# Patient Record
Sex: Female | Born: 1945 | Race: White | Hispanic: No | Marital: Married | Smoking: Never smoker
Health system: Southern US, Community
[De-identification: ages and names within clinical notes are randomized; demographics above are authoritative.]

## PROBLEM LIST (undated history)

## (undated) DIAGNOSIS — E669 Obesity, unspecified: Secondary | ICD-10-CM

## (undated) DIAGNOSIS — J309 Allergic rhinitis, unspecified: Secondary | ICD-10-CM

## (undated) DIAGNOSIS — E785 Hyperlipidemia, unspecified: Secondary | ICD-10-CM

## (undated) DIAGNOSIS — I10 Essential (primary) hypertension: Secondary | ICD-10-CM

## (undated) DIAGNOSIS — M545 Low back pain, unspecified: Secondary | ICD-10-CM

## (undated) DIAGNOSIS — C50919 Malignant neoplasm of unspecified site of unspecified female breast: Secondary | ICD-10-CM

## (undated) DIAGNOSIS — E119 Type 2 diabetes mellitus without complications: Secondary | ICD-10-CM

## (undated) DIAGNOSIS — K7689 Other specified diseases of liver: Secondary | ICD-10-CM

## (undated) DIAGNOSIS — K580 Irritable bowel syndrome with diarrhea: Secondary | ICD-10-CM

## (undated) DIAGNOSIS — F0781 Postconcussional syndrome: Secondary | ICD-10-CM

## (undated) DIAGNOSIS — E1169 Type 2 diabetes mellitus with other specified complication: Secondary | ICD-10-CM

## (undated) DIAGNOSIS — M1990 Unspecified osteoarthritis, unspecified site: Secondary | ICD-10-CM

## (undated) DIAGNOSIS — Z9221 Personal history of antineoplastic chemotherapy: Secondary | ICD-10-CM

## (undated) DIAGNOSIS — Z923 Personal history of irradiation: Secondary | ICD-10-CM

## (undated) DIAGNOSIS — F329 Major depressive disorder, single episode, unspecified: Secondary | ICD-10-CM

## (undated) DIAGNOSIS — C801 Malignant (primary) neoplasm, unspecified: Secondary | ICD-10-CM

## (undated) HISTORY — PX: ABDOMINAL HYSTERECTOMY: SHX81

## (undated) HISTORY — DX: Hyperlipidemia, unspecified: E78.5

## (undated) HISTORY — DX: Other specified diseases of liver: K76.89

## (undated) HISTORY — DX: Irritable bowel syndrome with diarrhea: K58.0

## (undated) HISTORY — DX: Major depressive disorder, single episode, unspecified: F32.9

## (undated) HISTORY — DX: Postconcussional syndrome: F07.81

## (undated) HISTORY — DX: Allergic rhinitis, unspecified: J30.9

## (undated) HISTORY — PX: CHOLECYSTECTOMY: SHX55

## (undated) HISTORY — DX: Essential (primary) hypertension: I10

## (undated) HISTORY — DX: Low back pain: M54.5

## (undated) HISTORY — DX: Unspecified osteoarthritis, unspecified site: M19.90

## (undated) HISTORY — DX: Malignant neoplasm of unspecified site of unspecified female breast: C50.919

## (undated) HISTORY — DX: Low back pain, unspecified: M54.50

---

## 2000-05-25 DIAGNOSIS — C801 Malignant (primary) neoplasm, unspecified: Secondary | ICD-10-CM

## 2000-05-25 HISTORY — DX: Malignant (primary) neoplasm, unspecified: C80.1

## 2000-05-25 HISTORY — PX: BREAST LUMPECTOMY: SHX2

## 2000-07-16 ENCOUNTER — Encounter: Admission: RE | Admit: 2000-07-16 | Discharge: 2000-07-16 | Payer: Self-pay | Admitting: Family Medicine

## 2000-07-16 ENCOUNTER — Encounter: Payer: Self-pay | Admitting: Family Medicine

## 2000-08-30 ENCOUNTER — Encounter: Admission: RE | Admit: 2000-08-30 | Discharge: 2000-08-30 | Payer: Self-pay | Admitting: Family Medicine

## 2000-08-30 ENCOUNTER — Encounter (INDEPENDENT_AMBULATORY_CARE_PROVIDER_SITE_OTHER): Payer: Self-pay | Admitting: *Deleted

## 2000-08-30 ENCOUNTER — Encounter: Payer: Self-pay | Admitting: Family Medicine

## 2000-08-30 ENCOUNTER — Other Ambulatory Visit: Admission: RE | Admit: 2000-08-30 | Discharge: 2000-08-30 | Payer: Self-pay | Admitting: Family Medicine

## 2000-08-30 HISTORY — PX: BREAST BIOPSY: SHX20

## 2000-09-10 ENCOUNTER — Encounter: Admission: RE | Admit: 2000-09-10 | Discharge: 2000-09-10 | Payer: Self-pay | Admitting: General Surgery

## 2000-09-10 ENCOUNTER — Encounter: Payer: Self-pay | Admitting: General Surgery

## 2000-09-13 ENCOUNTER — Encounter (INDEPENDENT_AMBULATORY_CARE_PROVIDER_SITE_OTHER): Payer: Self-pay | Admitting: Specialist

## 2000-09-13 ENCOUNTER — Ambulatory Visit (HOSPITAL_BASED_OUTPATIENT_CLINIC_OR_DEPARTMENT_OTHER): Admission: RE | Admit: 2000-09-13 | Discharge: 2000-09-13 | Payer: Self-pay | Admitting: General Surgery

## 2000-09-13 ENCOUNTER — Encounter: Payer: Self-pay | Admitting: General Surgery

## 2000-09-13 ENCOUNTER — Encounter: Admission: RE | Admit: 2000-09-13 | Discharge: 2000-09-13 | Payer: Self-pay | Admitting: Ophthalmology

## 2000-09-13 HISTORY — PX: EXCISION / BIOPSY BREAST / NIPPLE / DUCT: SUR469

## 2000-09-27 ENCOUNTER — Encounter (INDEPENDENT_AMBULATORY_CARE_PROVIDER_SITE_OTHER): Payer: Self-pay | Admitting: *Deleted

## 2000-09-27 ENCOUNTER — Ambulatory Visit (HOSPITAL_BASED_OUTPATIENT_CLINIC_OR_DEPARTMENT_OTHER): Admission: RE | Admit: 2000-09-27 | Discharge: 2000-09-27 | Payer: Self-pay | Admitting: General Surgery

## 2001-02-07 ENCOUNTER — Ambulatory Visit: Admission: RE | Admit: 2001-02-07 | Discharge: 2001-05-08 | Payer: Self-pay | Admitting: Radiation Oncology

## 2001-02-11 ENCOUNTER — Encounter: Admission: RE | Admit: 2001-02-11 | Discharge: 2001-02-11 | Payer: Self-pay | Admitting: Radiation Oncology

## 2001-09-08 ENCOUNTER — Encounter: Payer: Self-pay | Admitting: Oncology

## 2001-09-08 ENCOUNTER — Encounter: Admission: RE | Admit: 2001-09-08 | Discharge: 2001-09-08 | Payer: Self-pay | Admitting: Oncology

## 2002-09-11 ENCOUNTER — Encounter: Admission: RE | Admit: 2002-09-11 | Discharge: 2002-09-11 | Payer: Self-pay | Admitting: Oncology

## 2002-09-11 ENCOUNTER — Encounter: Payer: Self-pay | Admitting: Oncology

## 2003-05-02 ENCOUNTER — Other Ambulatory Visit: Admission: RE | Admit: 2003-05-02 | Discharge: 2003-05-02 | Payer: Self-pay | Admitting: Dermatology

## 2003-09-18 ENCOUNTER — Encounter: Admission: RE | Admit: 2003-09-18 | Discharge: 2003-09-18 | Payer: Self-pay | Admitting: General Surgery

## 2004-09-29 ENCOUNTER — Encounter: Admission: RE | Admit: 2004-09-29 | Discharge: 2004-09-29 | Payer: Self-pay | Admitting: General Surgery

## 2004-11-03 ENCOUNTER — Encounter: Admission: RE | Admit: 2004-11-03 | Discharge: 2004-11-03 | Payer: Self-pay | Admitting: General Surgery

## 2004-12-11 ENCOUNTER — Ambulatory Visit (HOSPITAL_COMMUNITY): Admission: RE | Admit: 2004-12-11 | Discharge: 2004-12-11 | Payer: Self-pay | Admitting: Unknown Physician Specialty

## 2005-10-08 ENCOUNTER — Encounter: Admission: RE | Admit: 2005-10-08 | Discharge: 2005-10-08 | Payer: Self-pay | Admitting: General Surgery

## 2006-04-06 ENCOUNTER — Emergency Department (HOSPITAL_COMMUNITY): Admission: EM | Admit: 2006-04-06 | Discharge: 2006-04-06 | Payer: Self-pay | Admitting: Emergency Medicine

## 2006-06-28 ENCOUNTER — Ambulatory Visit: Payer: Self-pay | Admitting: Oncology

## 2006-07-12 LAB — CBC WITH DIFFERENTIAL/PLATELET
BASO%: 0.9 % (ref 0.0–2.0)
EOS%: 4.5 % (ref 0.0–7.0)
LYMPH%: 29.6 % (ref 14.0–48.0)
MCH: 27.2 pg (ref 26.0–34.0)
NEUT#: 4.3 10*3/uL (ref 1.5–6.5)
NEUT%: 58.4 % (ref 39.6–76.8)
RDW: 14.6 % — ABNORMAL HIGH (ref 11.3–14.5)
WBC: 7.3 10*3/uL (ref 3.9–10.0)
lymph#: 2.2 10*3/uL (ref 0.9–3.3)

## 2006-07-12 LAB — COMPREHENSIVE METABOLIC PANEL
ALT: 50 U/L — ABNORMAL HIGH (ref 0–35)
CO2: 24 mEq/L (ref 19–32)
Calcium: 9.8 mg/dL (ref 8.4–10.5)
Glucose, Bld: 124 mg/dL — ABNORMAL HIGH (ref 70–99)
Total Bilirubin: 0.3 mg/dL (ref 0.3–1.2)

## 2006-07-12 LAB — PROTIME-INR: Protime: 12 Seconds (ref 10.6–13.4)

## 2006-10-12 ENCOUNTER — Encounter: Admission: RE | Admit: 2006-10-12 | Discharge: 2006-10-12 | Payer: Self-pay | Admitting: General Surgery

## 2006-11-10 ENCOUNTER — Ambulatory Visit: Payer: Self-pay | Admitting: Oncology

## 2006-11-12 LAB — COMPREHENSIVE METABOLIC PANEL
BUN: 11 mg/dL (ref 6–23)
CO2: 23 mEq/L (ref 19–32)
Calcium: 9.6 mg/dL (ref 8.4–10.5)
Chloride: 104 mEq/L (ref 96–112)
Creatinine, Ser: 0.66 mg/dL (ref 0.40–1.20)
Total Bilirubin: 0.3 mg/dL (ref 0.3–1.2)

## 2006-11-12 LAB — CBC WITH DIFFERENTIAL/PLATELET
BASO%: 0.4 % (ref 0.0–2.0)
Basophils Absolute: 0 10*3/uL (ref 0.0–0.1)
Eosinophils Absolute: 0.3 10*3/uL (ref 0.0–0.5)
HCT: 39.8 % (ref 34.8–46.6)
HGB: 13.8 g/dL (ref 11.6–15.9)
LYMPH%: 17.8 % (ref 14.0–48.0)
MCH: 28.2 pg (ref 26.0–34.0)
MCHC: 34.6 g/dL (ref 32.0–36.0)
MCV: 81.4 fL (ref 81.0–101.0)
MONO#: 0.4 10*3/uL (ref 0.1–0.9)
MONO%: 3.7 % (ref 0.0–13.0)
NEUT#: 7.7 10*3/uL — ABNORMAL HIGH (ref 1.5–6.5)
NEUT%: 75.2 % (ref 39.6–76.8)
Platelets: 224 10*3/uL (ref 145–400)
RBC: 4.88 10*6/uL (ref 3.70–5.32)
RDW: 14.1 % (ref 11.3–14.5)
lymph#: 1.8 10*3/uL (ref 0.9–3.3)

## 2006-11-12 LAB — LACTATE DEHYDROGENASE: LDH: 149 U/L (ref 94–250)

## 2007-01-26 ENCOUNTER — Ambulatory Visit: Payer: Self-pay | Admitting: Oncology

## 2007-07-11 ENCOUNTER — Ambulatory Visit: Payer: Self-pay | Admitting: Oncology

## 2007-07-13 LAB — CBC WITH DIFFERENTIAL/PLATELET
Basophils Absolute: 0 10*3/uL (ref 0.0–0.1)
EOS%: 3.7 % (ref 0.0–7.0)
Eosinophils Absolute: 0.4 10*3/uL (ref 0.0–0.5)
HGB: 13.9 g/dL (ref 11.6–15.9)
MCH: 27.4 pg (ref 26.0–34.0)
MCV: 80.1 fL — ABNORMAL LOW (ref 81.0–101.0)
MONO%: 4 % (ref 0.0–13.0)
NEUT#: 8 10*3/uL — ABNORMAL HIGH (ref 1.5–6.5)
Platelets: 273 10*3/uL (ref 145–400)

## 2007-07-13 LAB — COMPREHENSIVE METABOLIC PANEL
ALT: 88 U/L — ABNORMAL HIGH (ref 0–35)
AST: 63 U/L — ABNORMAL HIGH (ref 0–37)
Albumin: 4.4 g/dL (ref 3.5–5.2)
Alkaline Phosphatase: 113 U/L (ref 39–117)
BUN: 10 mg/dL (ref 6–23)
CO2: 25 mEq/L (ref 19–32)
Calcium: 10.1 mg/dL (ref 8.4–10.5)
Chloride: 102 mEq/L (ref 96–112)
Total Bilirubin: 0.4 mg/dL (ref 0.3–1.2)
Total Protein: 7.2 g/dL (ref 6.0–8.3)

## 2007-10-13 ENCOUNTER — Encounter: Admission: RE | Admit: 2007-10-13 | Discharge: 2007-10-13 | Payer: Self-pay | Admitting: Surgery

## 2008-07-06 ENCOUNTER — Ambulatory Visit: Payer: Self-pay | Admitting: Oncology

## 2008-07-30 LAB — CBC WITH DIFFERENTIAL/PLATELET
Basophils Absolute: 0 10*3/uL (ref 0.0–0.1)
Eosinophils Absolute: 0.4 10*3/uL (ref 0.0–0.5)
HGB: 13.3 g/dL (ref 11.6–15.9)
LYMPH%: 28.7 % (ref 14.0–49.7)
MCH: 27.2 pg (ref 25.1–34.0)
MCHC: 33.1 g/dL (ref 31.5–36.0)
MONO#: 0.3 10*3/uL (ref 0.1–0.9)
NEUT#: 3.3 10*3/uL (ref 1.5–6.5)
NEUT%: 58.9 % (ref 38.4–76.8)
WBC: 5.5 10*3/uL (ref 3.9–10.3)

## 2008-07-30 LAB — COMPREHENSIVE METABOLIC PANEL
ALT: 75 U/L — ABNORMAL HIGH (ref 0–35)
Albumin: 4.2 g/dL (ref 3.5–5.2)
BUN: 11 mg/dL (ref 6–23)
CO2: 21 mEq/L (ref 19–32)
Calcium: 8.9 mg/dL (ref 8.4–10.5)
Glucose, Bld: 286 mg/dL — ABNORMAL HIGH (ref 70–99)
Sodium: 142 mEq/L (ref 135–145)
Total Bilirubin: 0.3 mg/dL (ref 0.3–1.2)
Total Protein: 6.6 g/dL (ref 6.0–8.3)

## 2008-10-16 ENCOUNTER — Encounter: Admission: RE | Admit: 2008-10-16 | Discharge: 2008-10-16 | Payer: Self-pay | Admitting: Oncology

## 2009-07-26 ENCOUNTER — Ambulatory Visit: Payer: Self-pay | Admitting: Oncology

## 2009-08-12 LAB — CBC WITH DIFFERENTIAL/PLATELET
BASO%: 0.6 % (ref 0.0–2.0)
Basophils Absolute: 0 10*3/uL (ref 0.0–0.1)
Eosinophils Absolute: 0.4 10*3/uL (ref 0.0–0.5)
HCT: 41.3 % (ref 34.8–46.6)
HGB: 13.9 g/dL (ref 11.6–15.9)
MONO%: 5.8 % (ref 0.0–14.0)
NEUT#: 5.2 10*3/uL (ref 1.5–6.5)
NEUT%: 66.7 % (ref 38.4–76.8)
lymph#: 1.7 10*3/uL (ref 0.9–3.3)

## 2009-08-12 LAB — LACTATE DEHYDROGENASE: LDH: 182 U/L (ref 94–250)

## 2009-08-12 LAB — COMPREHENSIVE METABOLIC PANEL
CO2: 25 mEq/L (ref 19–32)
Chloride: 102 mEq/L (ref 96–112)
Potassium: 3.9 mEq/L (ref 3.5–5.3)
Sodium: 139 mEq/L (ref 135–145)
Total Bilirubin: 0.4 mg/dL (ref 0.3–1.2)

## 2009-10-17 ENCOUNTER — Encounter: Admission: RE | Admit: 2009-10-17 | Discharge: 2009-10-17 | Payer: Self-pay | Admitting: Oncology

## 2010-08-25 ENCOUNTER — Other Ambulatory Visit: Payer: Self-pay | Admitting: Oncology

## 2010-08-25 ENCOUNTER — Encounter (HOSPITAL_BASED_OUTPATIENT_CLINIC_OR_DEPARTMENT_OTHER): Payer: Medicare Other | Admitting: Oncology

## 2010-08-25 DIAGNOSIS — Z170 Estrogen receptor positive status [ER+]: Secondary | ICD-10-CM

## 2010-08-25 DIAGNOSIS — C50219 Malignant neoplasm of upper-inner quadrant of unspecified female breast: Secondary | ICD-10-CM

## 2010-08-25 LAB — COMPREHENSIVE METABOLIC PANEL
ALT: 75 U/L — ABNORMAL HIGH (ref 0–35)
AST: 42 U/L — ABNORMAL HIGH (ref 0–37)
BUN: 13 mg/dL (ref 6–23)
Calcium: 9.8 mg/dL (ref 8.4–10.5)
Glucose, Bld: 129 mg/dL — ABNORMAL HIGH (ref 70–99)
Total Protein: 7.6 g/dL (ref 6.0–8.3)

## 2010-08-25 LAB — CBC WITH DIFFERENTIAL/PLATELET
Eosinophils Absolute: 0.5 10*3/uL (ref 0.0–0.5)
HGB: 14.1 g/dL (ref 11.6–15.9)
MCH: 26.6 pg (ref 25.1–34.0)
MCHC: 32.7 g/dL (ref 31.5–36.0)
MCV: 81.4 fL (ref 79.5–101.0)
MONO#: 0.7 10*3/uL (ref 0.1–0.9)
RBC: 5.29 10*6/uL (ref 3.70–5.45)
WBC: 9.6 10*3/uL (ref 3.9–10.3)
lymph#: 2.8 10*3/uL (ref 0.9–3.3)

## 2010-08-25 LAB — LACTATE DEHYDROGENASE: LDH: 159 U/L (ref 94–250)

## 2010-09-08 ENCOUNTER — Other Ambulatory Visit: Payer: Self-pay | Admitting: Oncology

## 2010-09-08 DIAGNOSIS — Z1231 Encounter for screening mammogram for malignant neoplasm of breast: Secondary | ICD-10-CM

## 2010-10-10 NOTE — Op Note (Signed)
[place]  Patient:    Diana Hall, Diana Hall                      MRN: [id number] Proc. Date: 09/27/00 Adm. Date:  [id number] Attending:  Janalyn Rouse CC:         Genene Churn. Cyndie Chime, M.D.   Operative Report  PREOPERATIVE DIAGNOSIS:  Carcinoma of the right breast.  POSTOPERATIVE DIAGNOSIS:  Carcinoma of the right breast.  OPERATION PERFORMED:  Re-excision of the biopsy site to get clean margins.  SURGEON:  Rose Phi. Maple Hudson, M.D.  ANESTHESIA:  General.  INDICATIONS FOR PROCEDURE:  This patient had just undergone a partial mastectomy and sentinel node biopsy.  Her sentinel node was negative  and all the margins were clean except the 9 oclock margin of this lesion which was in the subareolar area.  I am now going to re-excise that 9 oclock margin to see if we can get a clean margin.  DESCRIPTION OF PROCEDURE:  After suitable general anesthesia was induced, the patient was placed in the supine position with the right arm extended.  The breast was prepped and draped in the usual fashion.  The old incision centered at the 3 oclock  position of the right breast was then made and we entered a seroma cavity and emptied it.  The 9 oclock margin was then excised hemostasis was obtained with the cautery.  Subcutaneous tissues closed with 3-0 Vicryl suture and the skin with subcuticular 4-0 Monocryl with Steri-Strips.  Dressing applied.  The patient was transferred to the recovery room in satisfactory condition having tolerated the procedure well. DD:  09/27/00 TD:  09/27/00 Job: [id number]

## 2010-10-10 NOTE — Op Note (Signed)
[place]  Patient:    Diana Hall, Diana Hall                      MRN: [id number] Proc. Date: 09/13/00 Adm. Date:  [id number] Attending:  Janalyn Rouse CC:         Dr. Wyvonnia Lora, [facility] D. Manson Passey, M.D., [facility]   Operative Report  PREOPERATIVE DIAGNOSIS:  Small carcinoma of the right breast.  POSTOPERATIVE DIAGNOSIS:  Small carcinoma of the right breast.  PROCEDURES: 1. Blue dye injection. 2. Right partial mastectomy with needle localization and specimen mammography. 3. Right axillary sentinel lymph node biopsy.  SURGEON:  Rose Phi. Maple Hudson, M.D.  ANESTHESIA:  General.  DESCRIPTION OF PROCEDURE:  This patient presented with an abnormal mammogram with a small lesion in the medial portion of her right breast.  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  Prior to coming to the operating room, 1 mCi of Technetium sulfur colloid was injected intradermally in the periareolar area.  In the operating room, we injected 5 cc of lymphazurin blue and compressed the breast for five minutes.  After suitable general anesthesia was induced, the patient was placed in the supine position with the right arm extended on the arm board.  The blue dye injection was carried out as described.  We then prepped and draped the breast and axilla.  Scanning of the internal mammary chain, the supraclavicular area, and the right axilla revealed only a hot spot in the right axilla.  A curved incision was made using the previously-placed wire and the skin marking as a guide in the medial portion of the right breast, and a wide excision of the wire and surrounding tissue was carried out.  Specimen was then oriented and then submitted to the radiologist for specimen mammography. Apparently the specimen mammogram failed to show it, but they then used the ultrasound to confirm that the lesion had been  removed.  The specimen then went to the pathologist for touch preps for margins.  As it turns out, the margins were negative but there was very close at the 9 oclock position. After having excised it and before the specimen left, I felt another nodule at the 9 oclock position, which I excised, so I think there is a good, clean margin there.  While the primary specimen was being evaluated, a short transverse axillary incision was made with dissection through the subcutaneous tissue to the clavipectoral fascia.  We then incised it, and I could identify a blue lymphatic going to a deep blue and hot lymph node, confirmed by the NeoProbe. I excised that node with clipping of the lymphatics.  There were no other lymph nodes that were either hot or blue or palpable.  That was submitted for a touch prep.  While that was being done, the incisions were closed with 3-0 Vicryl and subcuticular 4-0 Monocryls.  The sentinel node was also negative for metastatic disease.  Dressings were applied and the patient transferred to the recovery room in satisfactory condition, having tolerated the procedure well. DD:  09/13/00 TD:  09/14/00 Job: [id number]

## 2010-10-21 ENCOUNTER — Ambulatory Visit: Payer: Medicare Other

## 2010-11-04 ENCOUNTER — Ambulatory Visit
Admission: RE | Admit: 2010-11-04 | Discharge: 2010-11-04 | Disposition: A | Discharge status: Home / Self Care | Payer: Medicare Other | Source: Ambulatory Visit | Attending: Oncology | Admitting: Oncology

## 2010-11-04 DIAGNOSIS — Z1231 Encounter for screening mammogram for malignant neoplasm of breast: Secondary | ICD-10-CM

## 2011-07-04 ENCOUNTER — Telehealth: Payer: Self-pay | Admitting: Oncology

## 2011-07-04 NOTE — Telephone Encounter (Signed)
l/m and mailed appts aom

## 2011-07-20 ENCOUNTER — Other Ambulatory Visit: Payer: Self-pay | Admitting: Oncology

## 2011-07-20 DIAGNOSIS — Z1231 Encounter for screening mammogram for malignant neoplasm of breast: Secondary | ICD-10-CM

## 2011-10-12 ENCOUNTER — Telehealth: Payer: Self-pay

## 2011-10-12 ENCOUNTER — Ambulatory Visit: Payer: Medicare Other | Admitting: Oncology

## 2011-10-12 ENCOUNTER — Other Ambulatory Visit: Payer: Medicare Other | Admitting: Lab

## 2011-10-12 NOTE — Telephone Encounter (Signed)
Received message from switchboard that pt wishes to cancel today's appt.   Message to [facility], Systems developer. dph

## 2011-11-06 ENCOUNTER — Ambulatory Visit: Payer: Medicare Other

## 2011-11-24 ENCOUNTER — Ambulatory Visit: Payer: Medicare Other

## 2011-11-25 ENCOUNTER — Ambulatory Visit: Payer: Medicare Other

## 2012-03-07 ENCOUNTER — Ambulatory Visit
Admission: RE | Admit: 2012-03-07 | Discharge: 2012-03-07 | Disposition: A | Discharge status: Home / Self Care | Payer: Medicare Other | Source: Ambulatory Visit | Attending: Oncology | Admitting: Oncology

## 2012-03-07 DIAGNOSIS — Z1231 Encounter for screening mammogram for malignant neoplasm of breast: Secondary | ICD-10-CM

## 2013-02-20 ENCOUNTER — Telehealth: Payer: Self-pay | Admitting: *Deleted

## 2013-02-20 ENCOUNTER — Other Ambulatory Visit: Payer: Self-pay

## 2013-02-20 DIAGNOSIS — Z853 Personal history of malignant neoplasm of breast: Secondary | ICD-10-CM

## 2013-02-20 DIAGNOSIS — Z1231 Encounter for screening mammogram for malignant neoplasm of breast: Secondary | ICD-10-CM

## 2013-02-20 DIAGNOSIS — Z9889 Other specified postprocedural states: Secondary | ICD-10-CM

## 2013-02-20 NOTE — Telephone Encounter (Addendum)
Received vm call from pt stating that she normally sees Dr. Cyndie Chime but has been released but has a question.  She states that she received a letter from the [facility] which discussed a 3D mammogram & she wants to know Dr Patsy Lager opinion on this & if it would be advantageous to her.  Note to Dr Cyndie Chime.  Left message with pt's husband that 3D mammo is a good idea per Dr. Cyndie Chime.  He states that our radiologists have been recommending it.

## 2013-03-13 ENCOUNTER — Ambulatory Visit: Payer: Medicare Other

## 2013-03-20 ENCOUNTER — Ambulatory Visit
Admission: RE | Admit: 2013-03-20 | Discharge: 2013-03-20 | Disposition: A | Discharge status: Home / Self Care | Payer: Medicare HMO | Source: Ambulatory Visit

## 2013-03-20 DIAGNOSIS — Z9889 Other specified postprocedural states: Secondary | ICD-10-CM

## 2013-03-20 DIAGNOSIS — Z853 Personal history of malignant neoplasm of breast: Secondary | ICD-10-CM

## 2013-03-20 DIAGNOSIS — Z1231 Encounter for screening mammogram for malignant neoplasm of breast: Secondary | ICD-10-CM

## 2014-02-14 ENCOUNTER — Other Ambulatory Visit: Payer: Self-pay

## 2014-02-14 DIAGNOSIS — Z1231 Encounter for screening mammogram for malignant neoplasm of breast: Secondary | ICD-10-CM

## 2014-03-27 ENCOUNTER — Ambulatory Visit: Payer: Medicare HMO

## 2014-04-04 ENCOUNTER — Ambulatory Visit
Admission: RE | Admit: 2014-04-04 | Discharge: 2014-04-04 | Disposition: A | Discharge status: Home / Self Care | Payer: Medicare HMO | Source: Ambulatory Visit

## 2014-04-04 DIAGNOSIS — Z1231 Encounter for screening mammogram for malignant neoplasm of breast: Secondary | ICD-10-CM

## 2015-02-26 ENCOUNTER — Other Ambulatory Visit: Payer: Self-pay

## 2015-02-26 DIAGNOSIS — Z1231 Encounter for screening mammogram for malignant neoplasm of breast: Secondary | ICD-10-CM

## 2015-04-09 ENCOUNTER — Ambulatory Visit
Admission: RE | Admit: 2015-04-09 | Discharge: 2015-04-09 | Disposition: A | Discharge status: Home / Self Care | Payer: Medicare PPO | Source: Ambulatory Visit

## 2015-04-09 DIAGNOSIS — Z1231 Encounter for screening mammogram for malignant neoplasm of breast: Secondary | ICD-10-CM

## 2016-04-02 ENCOUNTER — Other Ambulatory Visit: Payer: Self-pay | Admitting: Family Medicine

## 2016-04-02 DIAGNOSIS — Z1231 Encounter for screening mammogram for malignant neoplasm of breast: Secondary | ICD-10-CM

## 2016-05-07 ENCOUNTER — Ambulatory Visit: Payer: Medicare PPO

## 2016-06-23 ENCOUNTER — Ambulatory Visit: Payer: Medicare PPO

## 2016-07-15 ENCOUNTER — Ambulatory Visit
Admission: RE | Admit: 2016-07-15 | Discharge: 2016-07-15 | Disposition: A | Discharge status: Home / Self Care | Payer: Self-pay | Source: Ambulatory Visit | Attending: Family Medicine | Admitting: Family Medicine

## 2016-07-15 DIAGNOSIS — Z1231 Encounter for screening mammogram for malignant neoplasm of breast: Secondary | ICD-10-CM

## 2016-11-05 ENCOUNTER — Inpatient Hospital Stay (HOSPITAL_COMMUNITY)
Admission: EM | Admit: 2016-11-05 | Discharge: 2016-11-07 | DRG: 088 | Disposition: A | Discharge status: Home Health | Payer: Medicare Other | Attending: Internal Medicine | Admitting: Internal Medicine

## 2016-11-05 ENCOUNTER — Encounter (HOSPITAL_COMMUNITY): Payer: Self-pay | Admitting: Emergency Medicine

## 2016-11-05 ENCOUNTER — Emergency Department (HOSPITAL_COMMUNITY): Payer: Medicare Other

## 2016-11-05 DIAGNOSIS — Z803 Family history of malignant neoplasm of breast: Secondary | ICD-10-CM

## 2016-11-05 DIAGNOSIS — R000 Tachycardia, unspecified: Secondary | ICD-10-CM | POA: Diagnosis present

## 2016-11-05 DIAGNOSIS — R410 Disorientation, unspecified: Secondary | ICD-10-CM | POA: Diagnosis present

## 2016-11-05 DIAGNOSIS — Z79811 Long term (current) use of aromatase inhibitors: Secondary | ICD-10-CM

## 2016-11-05 DIAGNOSIS — E785 Hyperlipidemia, unspecified: Secondary | ICD-10-CM | POA: Diagnosis present

## 2016-11-05 DIAGNOSIS — N179 Acute kidney failure, unspecified: Secondary | ICD-10-CM | POA: Diagnosis present

## 2016-11-05 DIAGNOSIS — E1122 Type 2 diabetes mellitus with diabetic chronic kidney disease: Secondary | ICD-10-CM | POA: Diagnosis present

## 2016-11-05 DIAGNOSIS — N390 Urinary tract infection, site not specified: Secondary | ICD-10-CM

## 2016-11-05 DIAGNOSIS — R402142 Coma scale, eyes open, spontaneous, at arrival to emergency department: Secondary | ICD-10-CM | POA: Diagnosis present

## 2016-11-05 DIAGNOSIS — R2681 Unsteadiness on feet: Secondary | ICD-10-CM | POA: Diagnosis present

## 2016-11-05 DIAGNOSIS — S60512A Abrasion of left hand, initial encounter: Secondary | ICD-10-CM | POA: Diagnosis present

## 2016-11-05 DIAGNOSIS — E1169 Type 2 diabetes mellitus with other specified complication: Secondary | ICD-10-CM | POA: Diagnosis present

## 2016-11-05 DIAGNOSIS — E1165 Type 2 diabetes mellitus with hyperglycemia: Secondary | ICD-10-CM | POA: Diagnosis present

## 2016-11-05 DIAGNOSIS — Z7982 Long term (current) use of aspirin: Secondary | ICD-10-CM

## 2016-11-05 DIAGNOSIS — R402252 Coma scale, best verbal response, oriented, at arrival to emergency department: Secondary | ICD-10-CM | POA: Diagnosis present

## 2016-11-05 DIAGNOSIS — R4182 Altered mental status, unspecified: Secondary | ICD-10-CM | POA: Diagnosis not present

## 2016-11-05 DIAGNOSIS — S060X0A Concussion without loss of consciousness, initial encounter: Principal | ICD-10-CM | POA: Diagnosis present

## 2016-11-05 DIAGNOSIS — I10 Essential (primary) hypertension: Secondary | ICD-10-CM | POA: Diagnosis present

## 2016-11-05 DIAGNOSIS — Z6828 Body mass index (BMI) 28.0-28.9, adult: Secondary | ICD-10-CM

## 2016-11-05 DIAGNOSIS — C50911 Malignant neoplasm of unspecified site of right female breast: Secondary | ICD-10-CM | POA: Diagnosis present

## 2016-11-05 DIAGNOSIS — E119 Type 2 diabetes mellitus without complications: Secondary | ICD-10-CM | POA: Diagnosis not present

## 2016-11-05 DIAGNOSIS — E669 Obesity, unspecified: Secondary | ICD-10-CM | POA: Diagnosis present

## 2016-11-05 DIAGNOSIS — R4701 Aphasia: Secondary | ICD-10-CM | POA: Diagnosis not present

## 2016-11-05 DIAGNOSIS — R739 Hyperglycemia, unspecified: Secondary | ICD-10-CM

## 2016-11-05 DIAGNOSIS — G9349 Other encephalopathy: Secondary | ICD-10-CM | POA: Diagnosis present

## 2016-11-05 DIAGNOSIS — S0081XA Abrasion of other part of head, initial encounter: Secondary | ICD-10-CM | POA: Diagnosis present

## 2016-11-05 DIAGNOSIS — W1830XA Fall on same level, unspecified, initial encounter: Secondary | ICD-10-CM | POA: Diagnosis present

## 2016-11-05 DIAGNOSIS — R402362 Coma scale, best motor response, obeys commands, at arrival to emergency department: Secondary | ICD-10-CM | POA: Diagnosis present

## 2016-11-05 DIAGNOSIS — R471 Dysarthria and anarthria: Secondary | ICD-10-CM | POA: Diagnosis present

## 2016-11-05 DIAGNOSIS — Z885 Allergy status to narcotic agent status: Secondary | ICD-10-CM

## 2016-11-05 DIAGNOSIS — Z91041 Radiographic dye allergy status: Secondary | ICD-10-CM

## 2016-11-05 DIAGNOSIS — T07XXXA Unspecified multiple injuries, initial encounter: Secondary | ICD-10-CM

## 2016-11-05 DIAGNOSIS — Z7984 Long term (current) use of oral hypoglycemic drugs: Secondary | ICD-10-CM

## 2016-11-05 DIAGNOSIS — R740 Nonspecific elevation of levels of transaminase and lactic acid dehydrogenase [LDH]: Secondary | ICD-10-CM | POA: Diagnosis present

## 2016-11-05 DIAGNOSIS — Z66 Do not resuscitate: Secondary | ICD-10-CM | POA: Diagnosis present

## 2016-11-05 DIAGNOSIS — F329 Major depressive disorder, single episode, unspecified: Secondary | ICD-10-CM | POA: Diagnosis present

## 2016-11-05 DIAGNOSIS — Z880 Allergy status to penicillin: Secondary | ICD-10-CM

## 2016-11-05 DIAGNOSIS — S60511A Abrasion of right hand, initial encounter: Secondary | ICD-10-CM | POA: Diagnosis present

## 2016-11-05 HISTORY — DX: Essential (primary) hypertension: I10

## 2016-11-05 HISTORY — DX: Type 2 diabetes mellitus without complications: E11.9

## 2016-11-05 HISTORY — DX: Obesity, unspecified: E11.69

## 2016-11-05 HISTORY — DX: Malignant (primary) neoplasm, unspecified: C80.1

## 2016-11-05 HISTORY — DX: Obesity, unspecified: E66.9

## 2016-11-05 LAB — CBC
HEMATOCRIT: 40 % (ref 36.0–46.0)
HEMOGLOBIN: 13.7 g/dL (ref 12.0–15.0)
MCH: 26.9 pg (ref 26.0–34.0)
MCHC: 34.3 g/dL (ref 30.0–36.0)
MCV: 78.6 fL (ref 78.0–100.0)
Platelets: 239 10*3/uL (ref 150–400)
RBC: 5.09 MIL/uL (ref 3.87–5.11)
RDW: 14.8 % (ref 11.5–15.5)
WBC: 13.3 10*3/uL — ABNORMAL HIGH (ref 4.0–10.5)

## 2016-11-05 LAB — COMPREHENSIVE METABOLIC PANEL
ALT: 69 U/L — AB (ref 14–54)
AST: 48 U/L — ABNORMAL HIGH (ref 15–41)
Albumin: 4.6 g/dL (ref 3.5–5.0)
Alkaline Phosphatase: 79 U/L (ref 38–126)
Anion gap: 12 (ref 5–15)
BUN: 20 mg/dL (ref 6–20)
CHLORIDE: 100 mmol/L — AB (ref 101–111)
CO2: 25 mmol/L (ref 22–32)
CREATININE: 1.37 mg/dL — AB (ref 0.44–1.00)
Calcium: 9.4 mg/dL (ref 8.9–10.3)
GFR calc Af Amer: 44 mL/min — ABNORMAL LOW (ref >60)
GFR calc non Af Amer: 38 mL/min — ABNORMAL LOW (ref >60)
Glucose, Bld: 314 mg/dL — ABNORMAL HIGH (ref 65–99)
POTASSIUM: 3.5 mmol/L (ref 3.5–5.1)
SODIUM: 137 mmol/L (ref 135–145)
Total Bilirubin: 0.9 mg/dL (ref 0.3–1.2)
Total Protein: 8.1 g/dL (ref 6.5–8.1)

## 2016-11-05 LAB — URINALYSIS, ROUTINE W REFLEX MICROSCOPIC
Glucose, UA: >=500 mg/dL — AB
Hgb urine dipstick: NEGATIVE
Ketones, ur: 5 mg/dL — AB
NITRITE: NEGATIVE
PH: 5 (ref 5.0–8.0)
Protein, ur: >=300 mg/dL — AB
SPECIFIC GRAVITY, URINE: 1.028 (ref 1.005–1.030)

## 2016-11-05 LAB — PROTIME-INR
INR: 1.05
Prothrombin Time: 13.7 seconds (ref 11.4–15.2)

## 2016-11-05 LAB — I-STAT TROPONIN, ED: TROPONIN I, POC: 0 ng/mL (ref 0.00–0.08)

## 2016-11-05 LAB — CBG MONITORING, ED: Glucose-Capillary: 295 mg/dL — ABNORMAL HIGH (ref 65–99)

## 2016-11-05 MED ORDER — LORAZEPAM 2 MG/ML IJ SOLN
1.0000 mg | Freq: Once | INTRAMUSCULAR | Status: AC
  Administered 2016-11-05: 1 mg via INTRAVENOUS
  Filled 2016-11-05: qty 1

## 2016-11-05 MED ORDER — CEFTRIAXONE SODIUM 1 G IJ SOLR
1.0000 g | Freq: Once | INTRAMUSCULAR | Status: AC
  Administered 2016-11-05: 1 g via INTRAVENOUS
  Filled 2016-11-05: qty 10

## 2016-11-05 MED ORDER — SODIUM CHLORIDE 0.9 % IV BOLUS (SEPSIS)
1000.0000 mL | Freq: Once | INTRAVENOUS | Status: AC
  Administered 2016-11-05: 1000 mL via INTRAVENOUS

## 2016-11-05 NOTE — ED Notes (Signed)
Patient placed on bedside monitor.

## 2016-11-05 NOTE — ED Notes (Signed)
Patient just returned from MRI, when RN walked in family was feeding pt [employer]. Patient and family informed that a swallow screen would be done when pt returned from MRI but family forgot.   Patient nero status greatly changed since going to MRI. Pt pulling at IV and wires. Speaking inaudible words. Patient unable to follow commands. When asked to squeeze RN's fingers, pt looked at RN and said yes but did not squeeze hands. Pt unable to read from [facility] sentences or focus on pictures to name object. This RN notified Domenic Moras, PA-C

## 2016-11-05 NOTE — ED Provider Notes (Signed)
Medical screening examination/treatment/procedure(s) were conducted as a shared visit with non-physician practitioner(s) and myself.  I personally evaluated the patient during the encounter.   EKG Interpretation None     71 year old female here with altered mental status after a mechanical fall today. No recent illnesses. Did strike her face. Head CT without acute findings. Her confusion has been improving. Neurological exam shows no slurred speech. She has good strength in all 4 extremity is. Does seem to have confusion with some commands. Possible head injury but will obtain MRI of brain.   Lacretia Leigh, MD 11/05/16 [phone]

## 2016-11-05 NOTE — ED Notes (Signed)
Patient transported to CT 

## 2016-11-05 NOTE — H&P (Signed)
History and Physical    Diana Hall:[id number] DOB: [birth year] DOA: 11/05/2016  PCP: Matthias Hughs, [facility] Consultants:  None Patient coming from: Home - lives with husband; NOK: husband, [phone] (cell); [phone]  Chief Complaint: AMS after fall  HPI: Diana Hall is a 71 y.o. female with medical history significant of HTN, DM, and remote h/o breast cancer presenting with confusion after she fell today about 415.  Husband was not there, but her grandson was with her.  EMS checked her out, she was lucid and did not appear to have issues other than scrapes on her face.  When they got her home, she developed difficulty speaking, could not understand her husband, wanted to sleep.  She alternates being very lucid, following instructions with being out of it, complete aphasia, speaks only syllables without words.  Physically restless.  This morning, she did complain of being dizzy - happens occasionally, not debilitating.  She did let her husband drive, which she never does.  In general, she sleeps a lot and is lethargic.   ED Course:  [place], facial injury, abrasions.  Became altered after the fall, dysarthric speech, not oriented.  Head CT, C-spine CT, and maxillofacial CT unremarkable.  CXR negative.  Glucose 314, WBC 13.3, creatinine 1.37. Given IVF.  MRI negative.  UA concerning for UTI, given Rocephin.    Review of Systems: Unable to perform  Ambulatory Status:  Ambulates without assistance  Past Medical History:  Diagnosis Date  . Cancer [facility]) 2002   breast cancer  . Diabetes mellitus type 2 in obese ([place])   . Hypertension     Past Surgical History:  Procedure Laterality Date  . BREAST BIOPSY Right 08/30/2000  . EXCISION / BIOPSY BREAST / NIPPLE / DUCT Right 09/13/2000    Social History   Social History  . Marital status: Married    Spouse name: N/A  . Number of children: N/A  . Years of education: N/A   Occupational History  . Retired     Social History Main Topics  . Smoking status: Never Smoker  . Smokeless tobacco: Never Used  . Alcohol use Yes     Comment: rare  . Drug use: No  . Sexual activity: Not on file   Other Topics Concern  . Not on file   Social History Narrative  . No narrative on file    Allergies  Allergen Reactions  . Iodinated Diagnostic Agents Anaphylaxis  . Penicillins Rash  . Hydrocodone-Acetaminophen Itching    Family History  Problem Relation Age of Onset  . Breast cancer Mother     Prior to Admission medications   Medication Sig Start Date End Date Taking? Authorizing Provider  aspirin 81 MG tablet Take 81 mg by mouth daily.   Yes [provider]  benazepril (LOTENSIN) 20 MG tablet Take 20 mg by mouth daily.   Yes [provider]  DULoxetine (CYMBALTA) 60 MG capsule Take 60 mg by mouth daily.   Yes [provider]  fish oil-omega-3 fatty acids 1000 MG capsule Take 1 g by mouth 2 (two) times daily.   Yes [provider]  glipiZIDE (GLUCOTROL XL) 10 MG 24 hr tablet Take 10 mg by mouth daily.   Yes [provider]  loratadine (CLARITIN) 10 MG tablet Take 10 mg by mouth daily.   Yes [provider]  loteprednol (LOTEMAX) 0.2 % SUSP 1 drop 4 (four) times daily.   Yes [provider]  Multiple Vitamin (MULTIVITAMIN) capsule Take 1 capsule by mouth daily.   Yes [provider]  omeprazole (PRILOSEC) 20 MG capsule Take 20 mg by mouth daily.   Yes [provider]  pravastatin (PRAVACHOL) 40 MG tablet Take 40 mg by mouth daily.   Yes [provider]  traZODone (DESYREL) 100 MG tablet Take 100 mg by mouth at bedtime.   Yes [provider]    Physical Exam: Vitals:   11/05/16 1751 11/05/16 2012 11/05/16 2103 11/05/16 2249  BP:  (!) 139/104 (!) 119/109 120/82  Pulse:  98 96 100  Resp:  20 16 20   Temp:      TempSrc:      SpO2:  99% 92% 97%  Height: 5\' 3"  (1.6 m)        General: Diffuse  wounds from her fall, patient is agitated, aphasic, and overall clearly altered Eyes:  normal lids, iris.  When attempting to examine her eyes, she refused to open them.  When I pried them open, she rolled them back in her head. ENT: grossly normal hearing, lips & tongue, mmm Neck:  no LAD, masses or thyromegaly Cardiovascular:  RRR, no m/r/g. No LE edema.  Respiratory:  CTA bilaterally, no w/r/r. Normal respiratory effort. Abdomen:  soft, ntnd, NABS Skin:  diffuse abrasions and lacerations from her fall, particularly on her face Musculoskeletal:  Appears to be normal based on her symmetric movements while writhing on the bed and ambulating to the bathroom Psychiatric: agitated, delirious, unable to cooperate Neurologic:  Unable to assess  Labs on Admission: I have personally reviewed following labs and imaging studies  CBC:  Recent Labs Lab 11/05/16 1805  WBC 13.3*  HGB 13.7  HCT 40.0  MCV 78.6  PLT 789   Basic Metabolic Panel:  Recent Labs Lab 11/05/16 1805  NA 137  K 3.5  CL 100*  CO2 25  GLUCOSE 314*  BUN 20  CREATININE 1.37*  CALCIUM 9.4   GFR: CrCl cannot be calculated (Unknown ideal weight.). Liver Function Tests:  Recent Labs Lab 11/05/16 1805  AST 48*  ALT 69*  ALKPHOS 79  BILITOT 0.9  PROT 8.1  ALBUMIN 4.6   No results for input(s): LIPASE, AMYLASE in the last 168 hours. No results for input(s): AMMONIA in the last 168 hours. Coagulation Profile:  Recent Labs Lab 11/05/16 1805  INR 1.05   Cardiac Enzymes: No results for input(s): CKTOTAL, CKMB, CKMBINDEX, TROPONINI in the last 168 hours. BNP (last 3 results) No results for input(s): PROBNP in the last 8760 hours. HbA1C: No results for input(s): HGBA1C in the last 72 hours. CBG:  Recent Labs Lab 11/05/16 1809 11/06/16 0053  GLUCAP 295* 242*   Lipid Profile: No results for input(s): CHOL, HDL, LDLCALC, TRIG, CHOLHDL, LDLDIRECT in the last 72 hours. Thyroid Function Tests: No  results for input(s): TSH, T4TOTAL, FREET4, T3FREE, THYROIDAB in the last 72 hours. Anemia Panel: No results for input(s): VITAMINB12, FOLATE, FERRITIN, TIBC, IRON, RETICCTPCT in the last 72 hours. Urine analysis:    Component Value Date/Time   COLORURINE AMBER (A) 11/05/2016 1913   APPEARANCEUR CLOUDY (A) 11/05/2016 1913   LABSPEC 1.028 11/05/2016 1913   PHURINE 5.0 11/05/2016 1913   GLUCOSEU >=500 (A) 11/05/2016 1913   HGBUR NEGATIVE 11/05/2016 1913   BILIRUBINUR MODERATE (A) 11/05/2016 1913   KETONESUR 5 (A) 11/05/2016 1913   PROTEINUR >=300 (A) 11/05/2016 1913   NITRITE NEGATIVE 11/05/2016 1913   LEUKOCYTESUR LARGE (A) 11/05/2016 1913  Creatinine Clearance: CrCl cannot be calculated (Unknown ideal weight.).  Sepsis Labs: @LABRCNTIP (procalcitonin:4,lacticidven:4) )No results found for this or any previous visit (from the past 240 hour(s)).   Radiological Exams on Admission: Dg Chest 2 View  Result Date: 11/05/2016 CLINICAL DATA:  Altered mental status after fall. EXAM: CHEST  2 VIEW COMPARISON:  12/20/2014 FINDINGS: The heart size and mediastinal contours are within normal limits. There is aortic atherosclerosis at the arch. There is a moderate-sized hiatal hernia projecting over the cardiac silhouette, stable in appearance since 2016 exam. Both lungs are clear. Axillary clips are seen on the right. The visualized skeletal structures are unremarkable. IMPRESSION: 1. No active cardiopulmonary disease. 2. Aortic atherosclerosis. 3. Moderate-sized hiatal hernia, stable in appearance. Electronically Signed   By: Ashley Royalty M.D.   On: 11/05/2016 19:50   Ct Head Wo Contrast  Result Date: 11/05/2016 CLINICAL DATA:  Altered mental status following a fall today. EXAM: CT HEAD WITHOUT CONTRAST TECHNIQUE: Contiguous axial images were obtained from the base of the skull through the vertex without intravenous contrast. COMPARISON:  Brain MR report dated 03/22/2011. FINDINGS: Brain:  Diffusely enlarged ventricles and subarachnoid spaces. Patchy white matter low density in both cerebral hemispheres. No intracranial hemorrhage, mass lesion or CT evidence of acute infarction. Vascular: No hyperdense vessel or unexpected calcification. Skull: Normal. Negative for fracture or focal lesion. Sinuses/Orbits: Status post bilateral cataract removal. Normally pneumatized included paranasal sinuses. Other: None. IMPRESSION: 1. No skull fracture or intracranial hemorrhage. 2. Mild diffuse cerebral and cerebellar atrophy and minimal chronic small vessel white matter ischemic changes in both cerebral hemispheres. Electronically Signed   By: Claudie Revering M.D.   On: 11/05/2016 19:13   Ct Cervical Spine Wo Contrast  Result Date: 11/05/2016 CLINICAL DATA:  Altered mental status following a fall today. The patient hit her face in the fall. EXAM: CT MAXILLOFACIAL WITHOUT CONTRAST CT CERVICAL SPINE WITHOUT CONTRAST TECHNIQUE: Multidetector CT imaging of the maxillofacial structures was performed. Multiplanar CT image reconstructions were also generated. A small metallic BB was placed on the right temple in order to reliably differentiate right from left. Multidetector CT imaging of the cervical spine was performed without intravenous contrast. Multiplanar CT image reconstructions were also generated. COMPARISON:  Head CT obtained earlier today. FINDINGS: CT MAXILLOFACIAL FINDINGS Osseous: The patient moved during the examination with resolved an artifacts, including reconstruction artifacts. Taking this into account, no fractures are seen. Orbits: Reconstruction artifacts with no fracture seen. Sinuses: Minimal bilateral maxillary sinus mucosal thickening. No fluid. Soft tissues: Unremarkable. Limited intracranial: No intracranial hemorrhage. CT CERVICAL FINDINGS Alignment: Normal alignment.  No subluxations. Skull base and vertebrae: No acute fracture. No primary bone lesion or focal pathologic process. Soft  tissues and spinal canal: No prevertebral fluid or swelling. No visible canal hematoma. Disc levels: Facet degenerative changes throughout the cervical spine. Moderate anterior and mild to moderate posterior spur formation and posterior disc bulging at the C6-7 level. Upper chest: Mild biapical pleural and parenchymal scarring. Other: None. IMPRESSION: 1. No maxillofacial fracture. 2. No cervical spine fracture or subluxation. 3. Multilevel cervical spine degenerative changes. 4. Mild chronic bilateral maxillary sinusitis. Electronically Signed   By: Claudie Revering M.D.   On: 11/05/2016 20:06   Mr Brain Limited Wo Contrast  Result Date: 11/05/2016 CLINICAL DATA:  [place] today, confusion.  History of breast cancer. EXAM: MRI HEAD WITHOUT CONTRAST TECHNIQUE: Multiplanar, multiecho pulse sequences of the brain and surrounding structures were obtained without intravenous contrast. Axial T1 and coronal T2 sequences  not obtained. Nondiagnostic gradient sequence. COMPARISON:  CT HEAD November 05, 2016 1814 hours FINDINGS: Sequences are moderately or severely motion degraded, patient had difficulty remaining still, and removed herself from the scanner. BRAIN: No reduced diffusion to suggest acute ischemia or hypercellular tumor. The ventricles and sulci are normal for patient's age. No suspicious parenchymal signal, masses or mass effect. LEFT internal capsule perivascular space. No abnormal extra-axial fluid collections. VASCULAR: Normal major intracranial vascular flow voids present at skull base. SKULL AND UPPER CERVICAL SPINE: No abnormal sellar expansion. No suspicious calvarial bone marrow signal. Craniocervical junction maintained. SINUSES/ORBITS: The mastoid air-cells and included paranasal sinuses are well-aerated. The included ocular globes and orbital contents are non-suspicious. Status post bilateral ocular lens implants OTHER: None. IMPRESSION: Limited motion degraded noncontrast MRI head without acute intracranial  process. Electronically Signed   By: Elon Alas M.D.   On: 11/05/2016 22:00   Ct Maxillofacial Wo Contrast  Result Date: 11/05/2016 CLINICAL DATA:  Altered mental status following a fall today. The patient hit her face in the fall. EXAM: CT MAXILLOFACIAL WITHOUT CONTRAST CT CERVICAL SPINE WITHOUT CONTRAST TECHNIQUE: Multidetector CT imaging of the maxillofacial structures was performed. Multiplanar CT image reconstructions were also generated. A small metallic BB was placed on the right temple in order to reliably differentiate right from left. Multidetector CT imaging of the cervical spine was performed without intravenous contrast. Multiplanar CT image reconstructions were also generated. COMPARISON:  Head CT obtained earlier today. FINDINGS: CT MAXILLOFACIAL FINDINGS Osseous: The patient moved during the examination with resolved an artifacts, including reconstruction artifacts. Taking this into account, no fractures are seen. Orbits: Reconstruction artifacts with no fracture seen. Sinuses: Minimal bilateral maxillary sinus mucosal thickening. No fluid. Soft tissues: Unremarkable. Limited intracranial: No intracranial hemorrhage. CT CERVICAL FINDINGS Alignment: Normal alignment.  No subluxations. Skull base and vertebrae: No acute fracture. No primary bone lesion or focal pathologic process. Soft tissues and spinal canal: No prevertebral fluid or swelling. No visible canal hematoma. Disc levels: Facet degenerative changes throughout the cervical spine. Moderate anterior and mild to moderate posterior spur formation and posterior disc bulging at the C6-7 level. Upper chest: Mild biapical pleural and parenchymal scarring. Other: None. IMPRESSION: 1. No maxillofacial fracture. 2. No cervical spine fracture or subluxation. 3. Multilevel cervical spine degenerative changes. 4. Mild chronic bilateral maxillary sinusitis. Electronically Signed   By: Claudie Revering M.D.   On: 11/05/2016 20:06    EKG:  Independently reviewed.  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  UTI in the elderly can present with AMS.  Will continue to treat with Rocephin. -She does not report h/o ETOH or known liver disease but her LFTs are slightly increased and were in 2012 (AST 48/ALT 69;  42/75 in 08/2010); will check ammonia level to evaluate for hepatic encephalopathy -She also has hyperglycemia (Glucose 314, 295, 242) with a h/o DM -Finally, she appears to have at least AKI (BUN 20/Creatinine 1.37/GFR 38; 13/0.77 in 08/2010), which if present from dehydration would also be a potential cause for her symptoms -Overall for now, will observe overnight with neuro checks, IVF, and antibiotics -If she is not improved tomorrow, will need to strongly consider neurology evaluation -Troponin negative -She will need a sitter for safety   DVT prophylaxis:  Lovenox  Code Status:  DNR - confirmed with family Family Communication: Husband and daughter were present throughout evaluation and provided all of the history Disposition Plan:  Home once clinically improved Consults called: None  Admission status: It is my clinical opinion that  referral for OBSERVATION is reasonable and necessary in this patient based on the above information provided. The aforementioned taken together are felt to place the patient at high risk for further clinical deterioration. However it is anticipated that the patient may be medically stable for discharge from the hospital within 24 to 48 hours.    Karmen Bongo MD [facility]  If 7PM-7AM, please contact night-coverage [email or web address] Password [email or web address]  11/06/2016, 1:12 AM

## 2016-11-05 NOTE — ED Notes (Signed)
Patient transported to MRI 

## 2016-11-05 NOTE — ED Triage Notes (Signed)
Patient comes in for AMS post fall today. Family states that patient is not on blood thinners. Family unsure if patient had any LOC.

## 2016-11-05 NOTE — ED Provider Notes (Signed)
[facility] DEPT Provider Note   CSN: [id number] Arrival date & time: 11/05/16  1747     History   Chief Complaint Chief Complaint  Patient presents with  . Fall  . Altered Mental Status    HPI Diana Hall is a 71 y.o. female.  HPI   71 year old female with history of breast cancer currently on Arimidex here for evaluation of altered mental status post fall today. History obtained through patient and through husband who is at bedside. Patient is a poor historian. Patient was with her grandson earlier today shopping. She had an apparent fall in front of [employer] store and landed on her face.  She suffered abrasion to R side of face, and both of her hands.  She is unable to tell me how or why she fell. Unable to recall any precipitating sxs.  She is currently denies having any significant pain. When asked if she has headache, neck pain, chest pain, pain to her hands or feet she denies. Husband mention that patient was dizzy earlier this morning, but she was fine the day before. After the fall which happened approximately 4 hrs ago, pt was promptly evaluated by EMS when bystander witnessed the fall.  EMS did evaluate pt and release her.  However once pt came home she became increasingly altered.  Husband sts her speech was not making any sense, therefore she was brought here for further evaluation.    Past Medical History:  Diagnosis Date  . Cancer [facility])    breast cancer    There are no active problems to display for this patient.   Past Surgical History:  Procedure Laterality Date  . BREAST BIOPSY Right 08/30/2000  . EXCISION / BIOPSY BREAST / NIPPLE / DUCT Right 09/13/2000    OB History    No data available       Home Medications    Prior to Admission medications   Medication Sig Start Date End Date Taking? Authorizing Provider  anastrozole (ARIMIDEX) 1 MG tablet Take 1 mg by mouth daily.    [provider]  aspirin 81 MG tablet Take 81 mg by mouth daily.     [provider]  benazepril (LOTENSIN) 20 MG tablet Take 20 mg by mouth daily.    [provider]  Calcium Carbonate-Vitamin D (CALCIUM 500 + D) 500-125 MG-UNIT TABS Take by mouth 2 (two) times daily.    [provider]  cholecalciferol (VITAMIN D) 1000 UNITS tablet Take 1,000 Units by mouth daily.    [provider]  DULoxetine (CYMBALTA) 60 MG capsule Take 60 mg by mouth daily.    [provider]  fish oil-omega-3 fatty acids 1000 MG capsule Take 1 g by mouth 2 (two) times daily.    [provider]  glipiZIDE (GLUCOTROL XL) 10 MG 24 hr tablet Take 10 mg by mouth daily.    [provider]  Glucosamine-Chondroit-Vit C-Mn (GLUCOSAMINE 1500 COMPLEX) CAPS Take by mouth daily.    [provider]  loratadine (CLARITIN) 10 MG tablet Take 10 mg by mouth daily.    [provider]  loteprednol (LOTEMAX) 0.2 % SUSP 1 drop 4 (four) times daily.    [provider]  meclizine (ANTIVERT) 25 MG tablet Take 25 mg by mouth 4 (four) times daily as needed.    [provider]  meloxicam (MOBIC) 15 MG tablet Take 15 mg by mouth daily.    [provider]  metFORMIN (GLUCOPHAGE) 500 MG tablet Take 500  mg by mouth 2 (two) times daily with a meal.    [provider]  Multiple Vitamin (MULTIVITAMIN) capsule Take 1 capsule by mouth daily.    [provider]  omeprazole (PRILOSEC) 20 MG capsule Take 20 mg by mouth daily.    [provider]  pravastatin (PRAVACHOL) 40 MG tablet Take 40 mg by mouth daily.    [provider]  traZODone (DESYREL) 100 MG tablet Take 100 mg by mouth at bedtime.    [provider]    Family History Family History  Problem Relation Age of Onset  . Breast cancer Mother     Social History Social History  Substance Use Topics  . Smoking status: Not on file  . Smokeless tobacco: Not on file  . Alcohol use Not on file     Allergies     Iodinated diagnostic agents; Penicillins; and Hydrocodone-acetaminophen   Review of Systems Review of Systems  Unable to perform ROS: Mental status change     Physical Exam Updated Vital Signs BP (!) 148/115 (BP Location: Right Arm)   Pulse 78   Temp 97.7 F (36.5 C) (Oral)   Resp 20   Ht 5\' 3"  (1.6 m)   SpO2 98%   Physical Exam  Constitutional: She appears well-developed and well-nourished. No distress.  HENT:  Abrasion noted to right zygomatic arch with associated ecchymosis to the right orbital region with mild tenderness to palpation but no significant crepitus or deformity noted. No hemotympanum, no septal hematoma, no malocclusion.  Eyes: Conjunctivae and EOM are normal. Pupils are equal, round, and reactive to light.  Neck: Normal range of motion. Neck supple.  No cervical midline spine tenderness  Cardiovascular: Normal rate and regular rhythm.   Pulmonary/Chest: Effort normal and breath sounds normal.  Abdominal: Soft. She exhibits no distension. There is no tenderness.  Neurological: She is alert. She has normal strength. She is disoriented. No cranial nerve deficit or sensory deficit. She displays a negative Romberg sign. Coordination abnormal. Gait normal. GCS eye subscore is 4. GCS verbal subscore is 5. GCS motor subscore is 6.  Alert to self only, unable to tell me the situation, month, or year. Speech is dysarthric.  Poor finger to nose, heel to shin coordination.    Skin: No rash noted.  Multiple skin abrasion noted to palms of hands bilaterally with superficial skin tear but no gross deformity.  Psychiatric: Her affect is inappropriate. Her speech is tangential. She is inattentive.  Nursing note and vitals reviewed.    ED Treatments / Results  Labs (all labs ordered are listed, but only abnormal results are displayed) Labs Reviewed  COMPREHENSIVE METABOLIC PANEL - Abnormal; Notable for the following:       Result Value   Chloride 100 (*)    Glucose, Bld  314 (*)    Creatinine, Ser 1.37 (*)    AST 48 (*)    ALT 69 (*)    GFR calc non Af Amer 38 (*)    GFR calc Af Amer 44 (*)    All other components within normal limits  CBC - Abnormal; Notable for the following:    WBC 13.3 (*)    All other components within normal limits  URINALYSIS, ROUTINE W REFLEX MICROSCOPIC - Abnormal; Notable for the following:    Color, Urine AMBER (*)    APPearance CLOUDY (*)    Glucose, UA >=500 (*)    Bilirubin Urine MODERATE (*)    Ketones, ur 5 (*)  Protein, ur >=300 (*)    Leukocytes, UA LARGE (*)    Bacteria, UA RARE (*)    Squamous Epithelial / LPF 6-30 (*)    Non Squamous Epithelial 0-5 (*)    All other components within normal limits  CBG MONITORING, ED - Abnormal; Notable for the following:    Glucose-Capillary 295 (*)    All other components within normal limits  URINE CULTURE  PROTIME-INR  I-STAT TROPOININ, ED    EKG  EKG Interpretation None     ED ECG REPORT   Date: 11/05/2016  Rate: 94  Rhythm: normal sinus rhythm  QRS Axis: left  Intervals: normal  ST/T Wave abnormalities: normal  Conduction Disutrbances:none  Narrative Interpretation:   Old EKG Reviewed: none available  I have personally reviewed the EKG tracing and agree with the computerized printout as noted.   Radiology Dg Chest 2 View  Result Date: 11/05/2016 CLINICAL DATA:  Altered mental status after fall. EXAM: CHEST  2 VIEW COMPARISON:  12/20/2014 FINDINGS: The heart size and mediastinal contours are within normal limits. There is aortic atherosclerosis at the arch. There is a moderate-sized hiatal hernia projecting over the cardiac silhouette, stable in appearance since 2016 exam. Both lungs are clear. Axillary clips are seen on the right. The visualized skeletal structures are unremarkable. IMPRESSION: 1. No active cardiopulmonary disease. 2. Aortic atherosclerosis. 3. Moderate-sized hiatal hernia, stable in appearance. Electronically Signed   By: Ashley Royalty  M.D.   On: 11/05/2016 19:50   Ct Head Wo Contrast  Result Date: 11/05/2016 CLINICAL DATA:  Altered mental status following a fall today. EXAM: CT HEAD WITHOUT CONTRAST TECHNIQUE: Contiguous axial images were obtained from the base of the skull through the vertex without intravenous contrast. COMPARISON:  Brain MR report dated 03/22/2011. FINDINGS: Brain: Diffusely enlarged ventricles and subarachnoid spaces. Patchy white matter low density in both cerebral hemispheres. No intracranial hemorrhage, mass lesion or CT evidence of acute infarction. Vascular: No hyperdense vessel or unexpected calcification. Skull: Normal. Negative for fracture or focal lesion. Sinuses/Orbits: Status post bilateral cataract removal. Normally pneumatized included paranasal sinuses. Other: None. IMPRESSION: 1. No skull fracture or intracranial hemorrhage. 2. Mild diffuse cerebral and cerebellar atrophy and minimal chronic small vessel white matter ischemic changes in both cerebral hemispheres. Electronically Signed   By: Claudie Revering M.D.   On: 11/05/2016 19:13   Ct Cervical Spine Wo Contrast  Result Date: 11/05/2016 CLINICAL DATA:  Altered mental status following a fall today. The patient hit her face in the fall. EXAM: CT MAXILLOFACIAL WITHOUT CONTRAST CT CERVICAL SPINE WITHOUT CONTRAST TECHNIQUE: Multidetector CT imaging of the maxillofacial structures was performed. Multiplanar CT image reconstructions were also generated. A small metallic BB was placed on the right temple in order to reliably differentiate right from left. Multidetector CT imaging of the cervical spine was performed without intravenous contrast. Multiplanar CT image reconstructions were also generated. COMPARISON:  Head CT obtained earlier today. FINDINGS: CT MAXILLOFACIAL FINDINGS Osseous: The patient moved during the examination with resolved an artifacts, including reconstruction artifacts. Taking this into account, no fractures are seen. Orbits:  Reconstruction artifacts with no fracture seen. Sinuses: Minimal bilateral maxillary sinus mucosal thickening. No fluid. Soft tissues: Unremarkable. Limited intracranial: No intracranial hemorrhage. CT CERVICAL FINDINGS Alignment: Normal alignment.  No subluxations. Skull base and vertebrae: No acute fracture. No primary bone lesion or focal pathologic process. Soft tissues and spinal canal: No prevertebral fluid or swelling. No visible canal hematoma. Disc levels: Facet degenerative changes throughout the  cervical spine. Moderate anterior and mild to moderate posterior spur formation and posterior disc bulging at the C6-7 level. Upper chest: Mild biapical pleural and parenchymal scarring. Other: None. IMPRESSION: 1. No maxillofacial fracture. 2. No cervical spine fracture or subluxation. 3. Multilevel cervical spine degenerative changes. 4. Mild chronic bilateral maxillary sinusitis. Electronically Signed   By: Claudie Revering M.D.   On: 11/05/2016 20:06   Mr Brain Limited Wo Contrast  Result Date: 11/05/2016 CLINICAL DATA:  [place] today, confusion.  History of breast cancer. EXAM: MRI HEAD WITHOUT CONTRAST TECHNIQUE: Multiplanar, multiecho pulse sequences of the brain and surrounding structures were obtained without intravenous contrast. Axial T1 and coronal T2 sequences not obtained. Nondiagnostic gradient sequence. COMPARISON:  CT HEAD November 05, 2016 1814 hours FINDINGS: Sequences are moderately or severely motion degraded, patient had difficulty remaining still, and removed herself from the scanner. BRAIN: No reduced diffusion to suggest acute ischemia or hypercellular tumor. The ventricles and sulci are normal for patient's age. No suspicious parenchymal signal, masses or mass effect. LEFT internal capsule perivascular space. No abnormal extra-axial fluid collections. VASCULAR: Normal major intracranial vascular flow voids present at skull base. SKULL AND UPPER CERVICAL SPINE: No abnormal sellar expansion. No  suspicious calvarial bone marrow signal. Craniocervical junction maintained. SINUSES/ORBITS: The mastoid air-cells and included paranasal sinuses are well-aerated. The included ocular globes and orbital contents are non-suspicious. Status post bilateral ocular lens implants OTHER: None. IMPRESSION: Limited motion degraded noncontrast MRI head without acute intracranial process. Electronically Signed   By: Elon Alas M.D.   On: 11/05/2016 22:00   Ct Maxillofacial Wo Contrast  Result Date: 11/05/2016 CLINICAL DATA:  Altered mental status following a fall today. The patient hit her face in the fall. EXAM: CT MAXILLOFACIAL WITHOUT CONTRAST CT CERVICAL SPINE WITHOUT CONTRAST TECHNIQUE: Multidetector CT imaging of the maxillofacial structures was performed. Multiplanar CT image reconstructions were also generated. A small metallic BB was placed on the right temple in order to reliably differentiate right from left. Multidetector CT imaging of the cervical spine was performed without intravenous contrast. Multiplanar CT image reconstructions were also generated. COMPARISON:  Head CT obtained earlier today. FINDINGS: CT MAXILLOFACIAL FINDINGS Osseous: The patient moved during the examination with resolved an artifacts, including reconstruction artifacts. Taking this into account, no fractures are seen. Orbits: Reconstruction artifacts with no fracture seen. Sinuses: Minimal bilateral maxillary sinus mucosal thickening. No fluid. Soft tissues: Unremarkable. Limited intracranial: No intracranial hemorrhage. CT CERVICAL FINDINGS Alignment: Normal alignment.  No subluxations. Skull base and vertebrae: No acute fracture. No primary bone lesion or focal pathologic process. Soft tissues and spinal canal: No prevertebral fluid or swelling. No visible canal hematoma. Disc levels: Facet degenerative changes throughout the cervical spine. Moderate anterior and mild to moderate posterior spur formation and posterior disc  bulging at the C6-7 level. Upper chest: Mild biapical pleural and parenchymal scarring. Other: None. IMPRESSION: 1. No maxillofacial fracture. 2. No cervical spine fracture or subluxation. 3. Multilevel cervical spine degenerative changes. 4. Mild chronic bilateral maxillary sinusitis. Electronically Signed   By: Claudie Revering M.D.   On: 11/05/2016 20:06    Procedures Procedures (including critical care time)  Medications Ordered in ED Medications  sodium chloride 0.9 % bolus 1,000 mL (1,000 mLs Intravenous New Bag/Given 11/05/16 2012)  LORazepam (ATIVAN) injection 1 mg (1 mg Intravenous Given 11/05/16 2107)  cefTRIAXone (ROCEPHIN) 1 g in dextrose 5 % 50 mL IVPB (1 g Intravenous New Bag/Given 11/05/16 2213)     Initial Impression / Assessment  and Plan / ED Course  I have reviewed the triage vital signs and the nursing notes.  Pertinent labs & imaging results that were available during my care of the patient were reviewed by me and considered in my medical decision making (see chart for details).  Clinical Course as of Nov 05 2245  Thu Nov 05, 2016  2042 DG Chest 2 View [GB]    Clinical Course User Index [GB] Hans Eden    BP (!) 119/109 (BP Location: Left Arm)   Pulse 96   Temp 97.7 F (36.5 C) (Oral)   Resp 16   Ht 5\' 3"  (1.6 m)   SpO2 92%    Final Clinical Impressions(s) / ED Diagnoses   Final diagnoses:  Lower urinary tract infectious disease  Delirium  Concussion without loss of consciousness, initial encounter  Hyperglycemia  Abrasions of multiple sites    New Prescriptions New Prescriptions   No medications on file   7:15 PM Patient fell earlier today suffered a facial injury and abrasion to both hands. Unsure if she has any precipitating symptoms prior to fall. She, however became altered after the fall. She has dysarthric speech, she is not oriented.  Work up initiated.  Care discussed with Dr. Zenia Resides.    8:03 PM Head CT scan without skull  fracture or intracranial injury. Plan to obtain cervical spine CT as well as maxillofacial. Her chest x-ray without acute finding. She is hyperglycemic with a CBG of 314, normal anion. She does have a mildly elevated WBC of 13.3. She has elevated creatinine of 1.37. IV fluid given. Will obtain a brain MRI to rule out stroke. Her confusion may be secondary to concussion.  9:09 PM UA showing finding suggestive of a UTI.  Urine culture sent, Rocephin abx started.   10:46 PM MRI is motion degraded but no acute finding noted.  Pt still altered, and acting inappropriately, does not follow command, requiring multiple attempts to redirect.  Appreciate consultation from [facility] Dr. Lorin Mercy who will see pt in the ER and admit for further care.     Domenic Moras, PA-C 11/05/16 2248

## 2016-11-06 ENCOUNTER — Observation Stay (HOSPITAL_COMMUNITY)
Admit: 2016-11-06 | Discharge: 2016-11-06 | Disposition: A | Discharge status: Home / Self Care | Payer: Medicare Other | Attending: Internal Medicine | Admitting: Internal Medicine

## 2016-11-06 DIAGNOSIS — C50911 Malignant neoplasm of unspecified site of right female breast: Secondary | ICD-10-CM

## 2016-11-06 DIAGNOSIS — S60511A Abrasion of right hand, initial encounter: Secondary | ICD-10-CM | POA: Diagnosis present

## 2016-11-06 DIAGNOSIS — R739 Hyperglycemia, unspecified: Secondary | ICD-10-CM

## 2016-11-06 DIAGNOSIS — S060X0A Concussion without loss of consciousness, initial encounter: Principal | ICD-10-CM

## 2016-11-06 DIAGNOSIS — R4701 Aphasia: Secondary | ICD-10-CM | POA: Diagnosis present

## 2016-11-06 DIAGNOSIS — R740 Nonspecific elevation of levels of transaminase and lactic acid dehydrogenase [LDH]: Secondary | ICD-10-CM | POA: Diagnosis present

## 2016-11-06 DIAGNOSIS — G9341 Metabolic encephalopathy: Secondary | ICD-10-CM

## 2016-11-06 DIAGNOSIS — Z66 Do not resuscitate: Secondary | ICD-10-CM | POA: Diagnosis present

## 2016-11-06 DIAGNOSIS — E1122 Type 2 diabetes mellitus with diabetic chronic kidney disease: Secondary | ICD-10-CM | POA: Diagnosis present

## 2016-11-06 DIAGNOSIS — E669 Obesity, unspecified: Secondary | ICD-10-CM | POA: Diagnosis present

## 2016-11-06 DIAGNOSIS — S0081XA Abrasion of other part of head, initial encounter: Secondary | ICD-10-CM | POA: Diagnosis present

## 2016-11-06 DIAGNOSIS — Z79811 Long term (current) use of aromatase inhibitors: Secondary | ICD-10-CM | POA: Diagnosis not present

## 2016-11-06 DIAGNOSIS — N390 Urinary tract infection, site not specified: Secondary | ICD-10-CM

## 2016-11-06 DIAGNOSIS — R471 Dysarthria and anarthria: Secondary | ICD-10-CM | POA: Diagnosis present

## 2016-11-06 DIAGNOSIS — A419 Sepsis, unspecified organism: Secondary | ICD-10-CM | POA: Diagnosis not present

## 2016-11-06 DIAGNOSIS — G9349 Other encephalopathy: Secondary | ICD-10-CM | POA: Diagnosis present

## 2016-11-06 DIAGNOSIS — R402252 Coma scale, best verbal response, oriented, at arrival to emergency department: Secondary | ICD-10-CM | POA: Diagnosis present

## 2016-11-06 DIAGNOSIS — R410 Disorientation, unspecified: Secondary | ICD-10-CM | POA: Diagnosis not present

## 2016-11-06 DIAGNOSIS — S60512A Abrasion of left hand, initial encounter: Secondary | ICD-10-CM | POA: Diagnosis present

## 2016-11-06 DIAGNOSIS — I10 Essential (primary) hypertension: Secondary | ICD-10-CM

## 2016-11-06 DIAGNOSIS — N179 Acute kidney failure, unspecified: Secondary | ICD-10-CM | POA: Diagnosis present

## 2016-11-06 DIAGNOSIS — Z803 Family history of malignant neoplasm of breast: Secondary | ICD-10-CM | POA: Diagnosis not present

## 2016-11-06 DIAGNOSIS — R000 Tachycardia, unspecified: Secondary | ICD-10-CM | POA: Diagnosis present

## 2016-11-06 DIAGNOSIS — E1169 Type 2 diabetes mellitus with other specified complication: Secondary | ICD-10-CM | POA: Diagnosis not present

## 2016-11-06 DIAGNOSIS — W1830XA Fall on same level, unspecified, initial encounter: Secondary | ICD-10-CM | POA: Diagnosis present

## 2016-11-06 DIAGNOSIS — E1165 Type 2 diabetes mellitus with hyperglycemia: Secondary | ICD-10-CM | POA: Diagnosis present

## 2016-11-06 DIAGNOSIS — R402142 Coma scale, eyes open, spontaneous, at arrival to emergency department: Secondary | ICD-10-CM | POA: Diagnosis present

## 2016-11-06 DIAGNOSIS — E785 Hyperlipidemia, unspecified: Secondary | ICD-10-CM | POA: Diagnosis present

## 2016-11-06 DIAGNOSIS — Z6828 Body mass index (BMI) 28.0-28.9, adult: Secondary | ICD-10-CM | POA: Diagnosis not present

## 2016-11-06 DIAGNOSIS — T07XXXA Unspecified multiple injuries, initial encounter: Secondary | ICD-10-CM | POA: Diagnosis not present

## 2016-11-06 DIAGNOSIS — R402362 Coma scale, best motor response, obeys commands, at arrival to emergency department: Secondary | ICD-10-CM | POA: Diagnosis present

## 2016-11-06 DIAGNOSIS — R4182 Altered mental status, unspecified: Secondary | ICD-10-CM | POA: Diagnosis present

## 2016-11-06 DIAGNOSIS — F329 Major depressive disorder, single episode, unspecified: Secondary | ICD-10-CM | POA: Diagnosis present

## 2016-11-06 LAB — CBC
HEMATOCRIT: 40.3 % (ref 36.0–46.0)
Hemoglobin: 13.6 g/dL (ref 12.0–15.0)
MCH: 26.8 pg (ref 26.0–34.0)
MCHC: 33.7 g/dL (ref 30.0–36.0)
MCV: 79.3 fL (ref 78.0–100.0)
PLATELETS: 159 10*3/uL (ref 150–400)
RBC: 5.08 MIL/uL (ref 3.87–5.11)
RDW: 15 % (ref 11.5–15.5)
WBC: 14.7 10*3/uL — AB (ref 4.0–10.5)

## 2016-11-06 LAB — GLUCOSE, CAPILLARY
GLUCOSE-CAPILLARY: 180 mg/dL — AB (ref 65–99)
GLUCOSE-CAPILLARY: 210 mg/dL — AB (ref 65–99)
GLUCOSE-CAPILLARY: 242 mg/dL — AB (ref 65–99)
Glucose-Capillary: 162 mg/dL — ABNORMAL HIGH (ref 65–99)
Glucose-Capillary: 167 mg/dL — ABNORMAL HIGH (ref 65–99)

## 2016-11-06 LAB — BASIC METABOLIC PANEL
Anion gap: 11 (ref 5–15)
BUN: 21 mg/dL — AB (ref 6–20)
CHLORIDE: 104 mmol/L (ref 101–111)
CO2: 23 mmol/L (ref 22–32)
CREATININE: 1.23 mg/dL — AB (ref 0.44–1.00)
Calcium: 8.9 mg/dL (ref 8.9–10.3)
GFR calc non Af Amer: 43 mL/min — ABNORMAL LOW (ref >60)
GFR, EST AFRICAN AMERICAN: 50 mL/min — AB (ref >60)
Glucose, Bld: 262 mg/dL — ABNORMAL HIGH (ref 65–99)
POTASSIUM: 4 mmol/L (ref 3.5–5.1)
SODIUM: 138 mmol/L (ref 135–145)

## 2016-11-06 LAB — MAGNESIUM: Magnesium: 1 mg/dL — ABNORMAL LOW (ref 1.7–2.4)

## 2016-11-06 LAB — AMMONIA: Ammonia: 17 umol/L (ref 9–35)

## 2016-11-06 LAB — TSH: TSH: 0.86 u[IU]/mL (ref 0.350–4.500)

## 2016-11-06 LAB — FOLATE: FOLATE: 37.9 ng/mL (ref >5.9)

## 2016-11-06 LAB — VITAMIN B12: Vitamin B-12: 430 pg/mL (ref 180–914)

## 2016-11-06 MED ORDER — LACTATED RINGERS IV SOLN
INTRAVENOUS | Status: DC
  Administered 2016-11-06 – 2016-11-07 (×3): via INTRAVENOUS

## 2016-11-06 MED ORDER — DEXTROSE 5 % IV SOLN
1.0000 g | INTRAVENOUS | Status: DC
  Administered 2016-11-06: 1 g via INTRAVENOUS
  Filled 2016-11-06 (×2): qty 10

## 2016-11-06 MED ORDER — ACETAMINOPHEN 325 MG PO TABS
650.0000 mg | ORAL_TABLET | Freq: Four times a day (QID) | ORAL | Status: DC | PRN
  Administered 2016-11-06: 650 mg via ORAL
  Filled 2016-11-06: qty 2

## 2016-11-06 MED ORDER — ACETAMINOPHEN 650 MG RE SUPP: 650.0000 mg | Freq: Four times a day (QID) | RECTAL | Status: DC | PRN

## 2016-11-06 MED ORDER — ONDANSETRON HCL 4 MG PO TABS: 4.0000 mg | ORAL_TABLET | Freq: Four times a day (QID) | ORAL | Status: DC | PRN

## 2016-11-06 MED ORDER — LOTEPREDNOL ETABONATE 0.5 % OP SUSP
1.0000 [drp] | Freq: Four times a day (QID) | OPHTHALMIC | Status: DC
  Administered 2016-11-06 (×3): 1 [drp] via OPHTHALMIC
  Filled 2016-11-06: qty 5

## 2016-11-06 MED ORDER — DULOXETINE HCL 30 MG PO CPEP
60.0000 mg | ORAL_CAPSULE | Freq: Every day | ORAL | Status: DC
  Administered 2016-11-07: 60 mg via ORAL
  Filled 2016-11-06: qty 2

## 2016-11-06 MED ORDER — ENOXAPARIN SODIUM 40 MG/0.4ML ~~LOC~~ SOLN
40.0000 mg | SUBCUTANEOUS | Status: DC
  Administered 2016-11-06 – 2016-11-07 (×2): 40 mg via SUBCUTANEOUS
  Filled 2016-11-06 (×2): qty 0.4

## 2016-11-06 MED ORDER — INSULIN ASPART 100 UNIT/ML ~~LOC~~ SOLN
0.0000 [IU] | Freq: Every day | SUBCUTANEOUS | Status: DC
  Administered 2016-11-06: 2 [IU] via SUBCUTANEOUS

## 2016-11-06 MED ORDER — INSULIN ASPART 100 UNIT/ML ~~LOC~~ SOLN
0.0000 [IU] | Freq: Three times a day (TID) | SUBCUTANEOUS | Status: DC
  Administered 2016-11-06: 5 [IU] via SUBCUTANEOUS
  Administered 2016-11-06 – 2016-11-07 (×3): 3 [IU] via SUBCUTANEOUS
  Administered 2016-11-07: 11 [IU] via SUBCUTANEOUS

## 2016-11-06 MED ORDER — ONDANSETRON HCL 4 MG/2ML IJ SOLN
4.0000 mg | Freq: Four times a day (QID) | INTRAMUSCULAR | Status: DC | PRN
  Administered 2016-11-06: 4 mg via INTRAVENOUS
  Filled 2016-11-06: qty 2

## 2016-11-06 NOTE — Progress Notes (Signed)
PROGRESS NOTE    Diana Hall  ZOX:[id number] DOB: 05-18-1946 DOA: 11/05/2016 PCP: System, Provider Not In   Chief Complaint  Patient presents with  . Fall  . Altered Mental Status    Brief Narrative:  HPI on 11/05/2016 by Dr. Karmen Bongo Diana Hall is a 71 y.o. female with medical history significant of HTN, DM, and remote h/o breast cancer presenting with confusion after she fell today about 415.  Husband was not there, but her grandson was with her.  EMS checked her out, she was lucid and did not appear to have issues other than scrapes on her face.  When they got her home, she developed difficulty speaking, could not understand her husband, wanted to sleep.  She alternates being very lucid, following instructions with being out of it, complete aphasia, speaks only syllables without words.  Physically restless.  This morning, she did complain of being dizzy - happens occasionally, not debilitating.  She did let her husband drive, which she never does.  In general, she sleeps a lot and is lethargic.  Assessment & Plan   Acute encephalopathy -unknown etiology -Suspected TIA vs CVA however, CT head and MRI brain showed no acute intracranial hemorrhage or skull fracture.  -Patient noted to have possible urinary tract infection, UA showed TNTC WBC, large leukocytes, rare bacteria -Chest x-ray unremarkable -Question whether this possibly is due to seizure versus concussion versus possible CVA as MRI was limited due to motion versus infection. -Discussed case with neurology, Dr.Lindzen, who will see the patient in formal consult. -Of note, patient did have a period of lucidness after her fall. Currently however she is unable to speak clearly, follow directions or commands. -Her son, at bedside patient normally is very lucid and sharp. She may have a very mild form of dementia, however never formally diagnosed. -Continue neuro checks -Ammonia 17 -Will obtain TSH, vitamin B-12, folate  levels -Given acute encephalopathy, patient currently NPO, statin and aspirin currently held  Possible urinary tract infection -UA as noted above -Urine culture pending -Patient currently afebrile, but with leukocytosis -Continue ceftriaxone  Leukocytosis/tachycardia, SIRS vs Sepsis -likely secondary to the above vs infection -Continue treatment and plan as above -Continue to monitor CBC -Will start IVF  Acute kidney injury versus chronic kidney disease -Upon admission, creatinine 1.37, currently 1.23 -Upon review of Epic, patient's creatinine was normal up until 2012, 0.7. However no labs in the past 6 years. Patient does have history of diabetes and hypertension, which made him themselves to chronic kidney disease. -Will continue to monitor BMP  Diabetes Mellitus, type II -glipizide held -Continue insulin sliding scale as CBG monitoring  Essential hypertension -Benazepril held given renal function -Will allow for permissive hypertension until evaluated by neurology  History of breast cancer -s/p right sided  Depression -Continue cymbalta  Hyperlipidemia -Statin held  Elevated transaminases -Appears to be chronic, patient's AST and ALT elevated, have been so since 2009 per review of Epic  DVT Prophylaxis  Lovenox  Code Status: DNR  Family Communication: Son at bedside  Disposition Plan: Currently in observatin  Consultants Neurology  Procedures  None  Antibiotics   Anti-infectives    Start     Dose/Rate Route Frequency Ordered Stop   11/06/16 2200  cefTRIAXone (ROCEPHIN) 1 g in dextrose 5 % 50 mL IVPB     1 g 100 mL/hr over 30 Minutes Intravenous Every 24 hours 11/06/16 0057     11/05/16 2115  cefTRIAXone (ROCEPHIN) 1 g in dextrose  5 % 50 mL IVPB     1 g 100 mL/hr over 30 Minutes Intravenous  Once 11/05/16 2109 11/05/16 2303      Subjective:   Maryjane Hurter seen and examined today.  Patient currently altered, speech nonsensical. Does not follow  commands. Son at bedside, states this is not patient's baseline.    Objective:   Vitals:   11/05/16 2012 11/05/16 2103 11/05/16 2249 11/06/16 0120  BP: (!) 139/104 (!) 119/109 120/82 (!) 151/93  Pulse: 98 96 100 (!) 102  Resp: 20 16 20    Temp:      TempSrc:      SpO2: 99% 92% 97% 96%  Height:        Intake/Output Summary (Last 24 hours) at 11/06/16 1053 Last data filed at 11/06/16 0300  Gross per 24 hour  Intake          1152.08 ml  Output                0 ml  Net          1152.08 ml   There were no vitals filed for this visit.  Exam  General: Well developed, well nourished, NAD  HEENT: [place], right sided orbital ecchymosis, PERRLA, EOMI, Anicteic Sclera, mucous membranes moist.   Neck: Supple, no JVD, no masses  Cardiovascular: S1 S2 auscultated, no rubs, murmurs or gallops. Regular rate and rhythm.  Respiratory: Clear to auscultation bilaterally with equal chest rise  Abdomen: Soft, nontender, nondistended, + bowel sounds  Extremities: warm dry without cyanosis clubbing or edema  Neuro: Unable to assess due to current status. Does not follow commands, moves extremities sporadically. Speech nonsensical  Skin: Without rashes exudates or nodules  Psych: Unable to assess due to current status.   Data Reviewed: I have personally reviewed following labs and imaging studies  CBC:  Recent Labs Lab 11/05/16 1805 11/06/16 0205  WBC 13.3* 14.7*  HGB 13.7 13.6  HCT 40.0 40.3  MCV 78.6 79.3  PLT 239 974   Basic Metabolic Panel:  Recent Labs Lab 11/05/16 1805 11/06/16 0205  NA 137 138  K 3.5 4.0  CL 100* 104  CO2 25 23  GLUCOSE 314* 262*  BUN 20 21*  CREATININE 1.37* 1.23*  CALCIUM 9.4 8.9   GFR: CrCl cannot be calculated (Unknown ideal weight.). Liver Function Tests:  Recent Labs Lab 11/05/16 1805  AST 48*  ALT 69*  ALKPHOS 79  BILITOT 0.9  PROT 8.1  ALBUMIN 4.6   No results for input(s): LIPASE, AMYLASE in the last 168 hours.  Recent  Labs Lab 11/06/16 0205  AMMONIA 17   Coagulation Profile:  Recent Labs Lab 11/05/16 1805  INR 1.05   Cardiac Enzymes: No results for input(s): CKTOTAL, CKMB, CKMBINDEX, TROPONINI in the last 168 hours. BNP (last 3 results) No results for input(s): PROBNP in the last 8760 hours. HbA1C: No results for input(s): HGBA1C in the last 72 hours. CBG:  Recent Labs Lab 11/05/16 1809 11/06/16 0053 11/06/16 0755  GLUCAP 295* 242* 210*   Lipid Profile: No results for input(s): CHOL, HDL, LDLCALC, TRIG, CHOLHDL, LDLDIRECT in the last 72 hours. Thyroid Function Tests: No results for input(s): TSH, T4TOTAL, FREET4, T3FREE, THYROIDAB in the last 72 hours. Anemia Panel: No results for input(s): VITAMINB12, FOLATE, FERRITIN, TIBC, IRON, RETICCTPCT in the last 72 hours. Urine analysis:    Component Value Date/Time   COLORURINE AMBER (A) 11/05/2016 1913   APPEARANCEUR CLOUDY (A) 11/05/2016 1913   LABSPEC  1.028 11/05/2016 1913   PHURINE 5.0 11/05/2016 1913   GLUCOSEU >=500 (A) 11/05/2016 1913   HGBUR NEGATIVE 11/05/2016 1913   BILIRUBINUR MODERATE (A) 11/05/2016 1913   KETONESUR 5 (A) 11/05/2016 1913   PROTEINUR >=300 (A) 11/05/2016 1913   NITRITE NEGATIVE 11/05/2016 1913   LEUKOCYTESUR LARGE (A) 11/05/2016 1913   Sepsis Labs: @LABRCNTIP (procalcitonin:4,lacticidven:4)  )No results found for this or any previous visit (from the past 240 hour(s)).    Radiology Studies: Dg Chest 2 View  Result Date: 11/05/2016 CLINICAL DATA:  Altered mental status after fall. EXAM: CHEST  2 VIEW COMPARISON:  12/20/2014 FINDINGS: The heart size and mediastinal contours are within normal limits. There is aortic atherosclerosis at the arch. There is a moderate-sized hiatal hernia projecting over the cardiac silhouette, stable in appearance since 2016 exam. Both lungs are clear. Axillary clips are seen on the right. The visualized skeletal structures are unremarkable. IMPRESSION: 1. No active  cardiopulmonary disease. 2. Aortic atherosclerosis. 3. Moderate-sized hiatal hernia, stable in appearance. Electronically Signed   By: Ashley Royalty M.D.   On: 11/05/2016 19:50   Ct Head Wo Contrast  Result Date: 11/05/2016 CLINICAL DATA:  Altered mental status following a fall today. EXAM: CT HEAD WITHOUT CONTRAST TECHNIQUE: Contiguous axial images were obtained from the base of the skull through the vertex without intravenous contrast. COMPARISON:  Brain MR report dated 03/22/2011. FINDINGS: Brain: Diffusely enlarged ventricles and subarachnoid spaces. Patchy white matter low density in both cerebral hemispheres. No intracranial hemorrhage, mass lesion or CT evidence of acute infarction. Vascular: No hyperdense vessel or unexpected calcification. Skull: Normal. Negative for fracture or focal lesion. Sinuses/Orbits: Status post bilateral cataract removal. Normally pneumatized included paranasal sinuses. Other: None. IMPRESSION: 1. No skull fracture or intracranial hemorrhage. 2. Mild diffuse cerebral and cerebellar atrophy and minimal chronic small vessel white matter ischemic changes in both cerebral hemispheres. Electronically Signed   By: Claudie Revering M.D.   On: 11/05/2016 19:13   Ct Cervical Spine Wo Contrast  Result Date: 11/05/2016 CLINICAL DATA:  Altered mental status following a fall today. The patient hit her face in the fall. EXAM: CT MAXILLOFACIAL WITHOUT CONTRAST CT CERVICAL SPINE WITHOUT CONTRAST TECHNIQUE: Multidetector CT imaging of the maxillofacial structures was performed. Multiplanar CT image reconstructions were also generated. A small metallic BB was placed on the right temple in order to reliably differentiate right from left. Multidetector CT imaging of the cervical spine was performed without intravenous contrast. Multiplanar CT image reconstructions were also generated. COMPARISON:  Head CT obtained earlier today. FINDINGS: CT MAXILLOFACIAL FINDINGS Osseous: The patient moved during  the examination with resolved an artifacts, including reconstruction artifacts. Taking this into account, no fractures are seen. Orbits: Reconstruction artifacts with no fracture seen. Sinuses: Minimal bilateral maxillary sinus mucosal thickening. No fluid. Soft tissues: Unremarkable. Limited intracranial: No intracranial hemorrhage. CT CERVICAL FINDINGS Alignment: Normal alignment.  No subluxations. Skull base and vertebrae: No acute fracture. No primary bone lesion or focal pathologic process. Soft tissues and spinal canal: No prevertebral fluid or swelling. No visible canal hematoma. Disc levels: Facet degenerative changes throughout the cervical spine. Moderate anterior and mild to moderate posterior spur formation and posterior disc bulging at the C6-7 level. Upper chest: Mild biapical pleural and parenchymal scarring. Other: None. IMPRESSION: 1. No maxillofacial fracture. 2. No cervical spine fracture or subluxation. 3. Multilevel cervical spine degenerative changes. 4. Mild chronic bilateral maxillary sinusitis. Electronically Signed   By: Claudie Revering M.D.   On: 11/05/2016 20:06  Mr Brain Limited Wo Contrast  Result Date: 11/05/2016 CLINICAL DATA:  [place] today, confusion.  History of breast cancer. EXAM: MRI HEAD WITHOUT CONTRAST TECHNIQUE: Multiplanar, multiecho pulse sequences of the brain and surrounding structures were obtained without intravenous contrast. Axial T1 and coronal T2 sequences not obtained. Nondiagnostic gradient sequence. COMPARISON:  CT HEAD November 05, 2016 1814 hours FINDINGS: Sequences are moderately or severely motion degraded, patient had difficulty remaining still, and removed herself from the scanner. BRAIN: No reduced diffusion to suggest acute ischemia or hypercellular tumor. The ventricles and sulci are normal for patient's age. No suspicious parenchymal signal, masses or mass effect. LEFT internal capsule perivascular space. No abnormal extra-axial fluid collections. VASCULAR:  Normal major intracranial vascular flow voids present at skull base. SKULL AND UPPER CERVICAL SPINE: No abnormal sellar expansion. No suspicious calvarial bone marrow signal. Craniocervical junction maintained. SINUSES/ORBITS: The mastoid air-cells and included paranasal sinuses are well-aerated. The included ocular globes and orbital contents are non-suspicious. Status post bilateral ocular lens implants OTHER: None. IMPRESSION: Limited motion degraded noncontrast MRI head without acute intracranial process. Electronically Signed   By: Elon Alas M.D.   On: 11/05/2016 22:00   Ct Maxillofacial Wo Contrast  Result Date: 11/05/2016 CLINICAL DATA:  Altered mental status following a fall today. The patient hit her face in the fall. EXAM: CT MAXILLOFACIAL WITHOUT CONTRAST CT CERVICAL SPINE WITHOUT CONTRAST TECHNIQUE: Multidetector CT imaging of the maxillofacial structures was performed. Multiplanar CT image reconstructions were also generated. A small metallic BB was placed on the right temple in order to reliably differentiate right from left. Multidetector CT imaging of the cervical spine was performed without intravenous contrast. Multiplanar CT image reconstructions were also generated. COMPARISON:  Head CT obtained earlier today. FINDINGS: CT MAXILLOFACIAL FINDINGS Osseous: The patient moved during the examination with resolved an artifacts, including reconstruction artifacts. Taking this into account, no fractures are seen. Orbits: Reconstruction artifacts with no fracture seen. Sinuses: Minimal bilateral maxillary sinus mucosal thickening. No fluid. Soft tissues: Unremarkable. Limited intracranial: No intracranial hemorrhage. CT CERVICAL FINDINGS Alignment: Normal alignment.  No subluxations. Skull base and vertebrae: No acute fracture. No primary bone lesion or focal pathologic process. Soft tissues and spinal canal: No prevertebral fluid or swelling. No visible canal hematoma. Disc levels: Facet  degenerative changes throughout the cervical spine. Moderate anterior and mild to moderate posterior spur formation and posterior disc bulging at the C6-7 level. Upper chest: Mild biapical pleural and parenchymal scarring. Other: None. IMPRESSION: 1. No maxillofacial fracture. 2. No cervical spine fracture or subluxation. 3. Multilevel cervical spine degenerative changes. 4. Mild chronic bilateral maxillary sinusitis. Electronically Signed   By: Claudie Revering M.D.   On: 11/05/2016 20:06     Scheduled Meds: . DULoxetine  60 mg Oral Daily  . enoxaparin (LOVENOX) injection  40 mg Subcutaneous Q24H  . insulin aspart  0-15 Units Subcutaneous TID WC  . insulin aspart  0-5 Units Subcutaneous QHS  . loteprednol  1 drop Both Eyes QID   Continuous Infusions: . cefTRIAXone (ROCEPHIN)  IV    . lactated ringers Stopped (11/06/16 0202)     LOS: 0 days   Time Spent in minutes   35 minutes  Pami Wool D.O. on 11/06/2016 at 10:53 AM  Between 7am to 7pm - Pager - [phone]  After 7pm go to [email or web address] - password [email or web address]  And look for the night coverage person covering for me after hours  [facility] Office  [phone]

## 2016-11-06 NOTE — Care Management Note (Signed)
Case Management Note  Patient Details  Name: Diana Hall MRN: [id number] Date of Birth: September 18, 1945  Subjective/Objective:                  71 y.o. female with medical history significant of HTN, DM, and remote h/o breast cancer presenting with confusion after she fell today about 415.  Husband was not there, but her grandson was with her.  EMS checked her out, she was lucid and did not appear to have issues other than scrapes on her face.  When they got her home, she developed difficulty speaking, could not understand her husband, wanted to sleep.  She alternates being very lucid, following instructions with being out of it, complete aphasia, speaks only syllables without words.  Physically restless.  This morning, she did complain of being dizzy - happens occasionally, not debilitating.  She did let her husband drive, which she never does.  In general, she sleeps a lot and is lethargic.   ED Course:  [place], facial injury, abrasions.  Became altered after the fall, dysarthric speech, not oriented.  Head CT, C-spine CT, and maxillofacial CT unremarkable.  CXR negative.  Glucose 314, WBC 13.3, creatinine 1.37. Given IVF.  MRI negative.  UA concerning for UTI, given Rocephin.    Action/Plan: Date:  November 06, 2016  Chart reviewed for concurrent status and case management needs.  Will continue to follow patient progress.  Discharge Planning: following for needs  Expected discharge date: [id number]  Velva Harman, BSN, [place], [place]   Expected Discharge Date:                  Expected Discharge Plan:  Home/Self Care  In-House Referral:     Discharge planning Services  CM Consult  Post Acute Care Choice:    Choice offered to:     DME Arranged:    DME Agency:     HH Arranged:    HH Agency:     Status of Service:  In process, will continue to follow  If discussed at Long Length of Stay Meetings, dates discussed:    Additional Comments:  Leeroy Cha, RN 11/06/2016,  9:11 AM

## 2016-11-06 NOTE — Progress Notes (Signed)
Pharmacy Antibiotic Note  Diana Hall is a 71 y.o. female with altered mental status after mechanical fall admitted on 11/05/2016 with UTI.  Pharmacy has been consulted for rocephin dosing.  Plan: Rocephin 1 Gm IV q24h Rx will sign off as no further adjustments needed  Height: 5\' 3"  (160 cm) IBW/kg (Calculated) : 52.4  Temp (24hrs), Avg:97.7 F (36.5 C), Min:97.7 F (36.5 C), Max:97.7 F (36.5 C)   Recent Labs Lab 11/05/16 1805  WBC 13.3*  CREATININE 1.37*    CrCl cannot be calculated (Unknown ideal weight.).    Allergies  Allergen Reactions  . Iodinated Diagnostic Agents Anaphylaxis  . Penicillins Rash  . Hydrocodone-Acetaminophen Itching    Antimicrobials this admission: 6/14 rocephin >>    >>   Dose adjustments this admission:   Microbiology results:  BCx:   UCx:    Sputum:    MRSA PCR:   Thank you for allowing pharmacy to be a part of this patient's care.  Dorrene German 11/06/2016 12:54 AM

## 2016-11-06 NOTE — Consult Note (Signed)
NEURO HOSPITALIST CONSULT NOTE   Requestig physician: Dr. Ree Kida  Reason for Consult: Receptive and expressive dysphasia following a fall with facial trauma  History obtained from:  Husband and Chart     HPI:                                                                                                                                          Diana Hall is an 71 y.o. female who presented to [facility] on Thursday evening for assessment of abnormal speech beginning approximately 30 minutes after striking her face during a fall at 4:15 PM. The fall occurred while walking out of a store. The pavement she was on was sloping/irregular and it is felt by her husband that she most likely tripped - of note, she does have a history of vertigo, the last spell being approximately 2 years ago with no recent complaints of such. She was being accompanied by her 52 year old grandson at the time of the fall, during which she struck the right side of her face hard enough to sustain significant bruising, edema and lacerations. She was lucid for about 30 minutes following the fall, then became confused and drowsy, with intermittent difficulty understanding speech as well as intermittent non-sensical speech output that the husband describes as gibberish. Per the admission H&P: "EMS checked her out, she was lucid and did not appear to have issues other than scrapes on her face.  When they got her home, she developed difficulty speaking, could not understand her husband, wanted to sleep.  She alternates being very lucid, following instructions with being out of it, complete aphasia, speaks only syllables without words. Physically restless." Her husband corroborates this at the time of the bedside interview prior to Neurological exam. She has never had such a spell in the past. Her husband denies any twitching, jerking, posturing or abnormal eye movements and states that he does not think that she has had a  seizure. Per her husband, in general she sleeps a lot and is "lethargic". He denies any history of visual hallucinations, abnormal movements during speech or Parkinsonian gait.   Head CT, C-spine CT, and maxillofacial CT were unremarkable. MRI was obtained, revealing no acute infarction or other DWI signal abnormality. Glucose was 314 and WBC 13.3. Urinalysis was concerning for a UTI and she was administered Rocephin.   She has a remote history of breast CA. Her PMHx also includes    Past Medical History:  Diagnosis Date  . Cancer [facility]) 2002   breast cancer  . Diabetes mellitus type 2 in obese ([place])   . Hypertension     Past Surgical History:  Procedure Laterality Date  . BREAST BIOPSY Right 08/30/2000  . EXCISION / BIOPSY BREAST / NIPPLE / DUCT  Right 09/13/2000    Family History  Problem Relation Age of Onset  . Breast cancer Mother     Social History:  reports that she has never smoked. She has never used smokeless tobacco. She reports that she drinks alcohol. She reports that she does not use drugs.  Allergies  Allergen Reactions  . Iodinated Diagnostic Agents Anaphylaxis  . Penicillins Rash  . Hydrocodone-Acetaminophen Itching    HOME MEDICATIONS:                                                                                                                     aspirin 81 MG tablet Take 81 mg by mouth daily. Karmen Bongo, MD Not Ordered  benazepril (LOTENSIN) 20 MG tablet Take 20 mg by mouth daily. Karmen Bongo, MD Not Ordered  DULoxetine (CYMBALTA) 60 MG capsule Take 60 mg by mouth daily. Karmen Bongo, MD Reordered  Orderedas:DULoxetine [facility]) DR capsule 60 mg - 60 mg, Oral, Daily, First dose on Fri 11/06/16 at 1000  fish oil-omega-3 fatty acids 1000 MG capsule Take 1 g by mouth 2 (two) times daily. Karmen Bongo, MD Not Ordered  glipiZIDE (GLUCOTROL XL) 10 MG 24 hr tablet Take 10 mg by mouth daily. Karmen Bongo, MD Not Ordered  loratadine (CLARITIN)  10 MG tablet Take 10 mg by mouth daily. Karmen Bongo, MD Not Ordered  loteprednol (LOTEMAX) 0.2 % SUSP 1 drop 4 (four) times daily. Karmen Bongo, MD Reordered  Orderedas:loteprednol [facility]) 0.5 % ophthalmic suspension 1 drop - 1 drop, Both Eyes, 4 times daily, First dose on Fri 11/06/16 at 1000  Multiple Vitamin (MULTIVITAMIN) capsule Take 1 capsule by mouth daily. Karmen Bongo, MD Not Ordered  omeprazole (PRILOSEC) 20 MG capsule Take 20 mg by mouth daily. Karmen Bongo, MD Not Ordered  pravastatin (PRAVACHOL) 40 MG tablet Take 40 mg by mouth daily. Karmen Bongo, MD Not Ordered  traZODone (DESYREL) 100 MG tablet Take 100 mg by mouth at bedtime. Karmen Bongo, MD Not Ordered     Current Facility-Administered Medications:  .  acetaminophen (TYLENOL) tablet 650 mg, 650 mg, Oral, Q6H PRN, 650 mg at 11/06/16 0222 **OR** acetaminophen (TYLENOL) suppository 650 mg, 650 mg, Rectal, Q6H PRN, Karmen Bongo, MD .  cefTRIAXone (ROCEPHIN) 1 g in dextrose 5 % 50 mL IVPB, 1 g, Intravenous, Q24H, Dorrene German, RPH .  DULoxetine (CYMBALTA) DR capsule 60 mg, 60 mg, Oral, Daily, Karmen Bongo, MD .  enoxaparin (LOVENOX) injection 40 mg, 40 mg, Subcutaneous, Q24H, Karmen Bongo, MD, 40 mg at 11/06/16 0859 .  insulin aspart (novoLOG) injection 0-15 Units, 0-15 Units, Subcutaneous, TID WC, Karmen Bongo, MD, 5 Units at 11/06/16 0900 .  insulin aspart (novoLOG) injection 0-5 Units, 0-5 Units, Subcutaneous, QHS, Karmen Bongo, MD, 2 Units at 11/06/16 0118 .  lactated ringers infusion, , Intravenous, Continuous, Karmen Bongo, MD, Stopped at 11/06/16 0202 .  loteprednol (LOTEMAX) 0.5 % ophthalmic suspension 1 drop, 1 drop, Both Eyes, QID, Karmen Bongo, MD, 1 drop at 11/06/16 0900 .  ondansetron (ZOFRAN) tablet 4 mg, 4 mg, Oral, Q6H PRN **OR** ondansetron (ZOFRAN) injection 4 mg, 4 mg, Intravenous, Q6H PRN, Karmen Bongo, MD, 4 mg at 11/06/16 0117   ROS:                                                                                                                                        As per HPI. The patient endorses no complaints in the context of her dysphasia.   Blood pressure (!) 151/93, pulse (!) 102, temperature 97.7 F (36.5 C), temperature source Oral, resp. rate 20, height 5\' 3"  (1.6 m), SpO2 96 %.  General Examination:                                                                                                      HEENT-  [place]/AT  Lungs- Respirations unlabored Extremities- Warm and well perfused  Neurological Examination Mental Status: Awake and alert. Oriented to self and hospital, not to day of week. Appears confused. Intermittently will follow simple commands and answer simple questions with short, fluent answers. At other times during interview, her speech output is abnormal, with clearly enunciated amalgamations of phonemes and consonants forming word-like utterances that are non-sensical and at times perseverative - essentially gibberish to simply describe this using non-medical terminology. At all times during the interview - not just intermittently - she exhibits impaired comprehension when she is asked questions of moderate complexity.  Cranial Nerves:  II: Visual fields intact bilaterally (tested with blink-to-threat). PERRL.  III,IV, VI: Swelling to right side of face; in this context no evidence for a CNIII or sympathetically-mediated ptosis. EOMI without nystagmus.  V,VII: In the context of right facial edema, muscle contraction during smiling does not appear to be impaired. Facial temperature sensation is grossly intact bilaterally.  VIII: hearing intact to questions and commands IX,X: no hypophonia or hoarseness XI: symmetric XII: midline tongue extension Motor: Right : Upper extremity   5/5    Left:     Upper extremity   5/5  Lower extremity   5/5     Lower extremity   5/5 Normal tone throughout; no atrophy noted Sensory: Temp and light  touch intact x 4 Deep Tendon Reflexes: 2+ and symmetric in upper and lower extremities Plantars: Right: downgoing  Left: downgoing Cerebellar: Normal FNF bilaterally Gait: Stands with own power. Walks with assistance down hallway with a slightly festinating and stooped quality. Also with mild unsteadiness. Decreased arm-swing noted. Steps are smaller than  normal. Question of mild spastic component to her gait.   Lab Results: Basic Metabolic Panel:  Recent Labs Lab 11/05/16 1805 11/06/16 0205  NA 137 138  K 3.5 4.0  CL 100* 104  CO2 25 23  GLUCOSE 314* 262*  BUN 20 21*  CREATININE 1.37* 1.23*  CALCIUM 9.4 8.9    Liver Function Tests:  Recent Labs Lab 11/05/16 1805  AST 48*  ALT 69*  ALKPHOS 79  BILITOT 0.9  PROT 8.1  ALBUMIN 4.6   No results for input(s): LIPASE, AMYLASE in the last 168 hours.  Recent Labs Lab 11/06/16 0205  AMMONIA 17    CBC:  Recent Labs Lab 11/05/16 1805 11/06/16 0205  WBC 13.3* 14.7*  HGB 13.7 13.6  HCT 40.0 40.3  MCV 78.6 79.3  PLT 239 159    Cardiac Enzymes: No results for input(s): CKTOTAL, CKMB, CKMBINDEX, TROPONINI in the last 168 hours.  Lipid Panel: No results for input(s): CHOL, TRIG, HDL, CHOLHDL, VLDL, LDLCALC in the last 168 hours.  CBG:  Recent Labs Lab 11/05/16 1809 11/06/16 0053 11/06/16 0755  GLUCAP 295* 242* 210*    Microbiology: No results found for this or any previous visit.  Coagulation Studies:  Recent Labs  11/05/16 1805  LABPROT 13.7  INR 1.05    Imaging: Dg Chest 2 View  Result Date: 11/05/2016 CLINICAL DATA:  Altered mental status after fall. EXAM: CHEST  2 VIEW COMPARISON:  12/20/2014 FINDINGS: The heart size and mediastinal contours are within normal limits. There is aortic atherosclerosis at the arch. There is a moderate-sized hiatal hernia projecting over the cardiac silhouette, stable in appearance since 2016 exam. Both lungs are clear. Axillary clips are seen on the right. The  visualized skeletal structures are unremarkable. IMPRESSION: 1. No active cardiopulmonary disease. 2. Aortic atherosclerosis. 3. Moderate-sized hiatal hernia, stable in appearance. Electronically Signed   By: Ashley Royalty M.D.   On: 11/05/2016 19:50   Ct Head Wo Contrast  Result Date: 11/05/2016 CLINICAL DATA:  Altered mental status following a fall today. EXAM: CT HEAD WITHOUT CONTRAST TECHNIQUE: Contiguous axial images were obtained from the base of the skull through the vertex without intravenous contrast. COMPARISON:  Brain MR report dated 03/22/2011. FINDINGS: Brain: Diffusely enlarged ventricles and subarachnoid spaces. Patchy white matter low density in both cerebral hemispheres. No intracranial hemorrhage, mass lesion or CT evidence of acute infarction. Vascular: No hyperdense vessel or unexpected calcification. Skull: Normal. Negative for fracture or focal lesion. Sinuses/Orbits: Status post bilateral cataract removal. Normally pneumatized included paranasal sinuses. Other: None. IMPRESSION: 1. No skull fracture or intracranial hemorrhage. 2. Mild diffuse cerebral and cerebellar atrophy and minimal chronic small vessel white matter ischemic changes in both cerebral hemispheres. Electronically Signed   By: Claudie Revering M.D.   On: 11/05/2016 19:13   Ct Cervical Spine Wo Contrast  Result Date: 11/05/2016 CLINICAL DATA:  Altered mental status following a fall today. The patient hit her face in the fall. EXAM: CT MAXILLOFACIAL WITHOUT CONTRAST CT CERVICAL SPINE WITHOUT CONTRAST TECHNIQUE: Multidetector CT imaging of the maxillofacial structures was performed. Multiplanar CT image reconstructions were also generated. A small metallic BB was placed on the right temple in order to reliably differentiate right from left. Multidetector CT imaging of the cervical spine was performed without intravenous contrast. Multiplanar CT image reconstructions were also generated. COMPARISON:  Head CT obtained earlier  today. FINDINGS: CT MAXILLOFACIAL FINDINGS Osseous: The patient moved during the examination with resolved an artifacts, including reconstruction artifacts. Taking  this into account, no fractures are seen. Orbits: Reconstruction artifacts with no fracture seen. Sinuses: Minimal bilateral maxillary sinus mucosal thickening. No fluid. Soft tissues: Unremarkable. Limited intracranial: No intracranial hemorrhage. CT CERVICAL FINDINGS Alignment: Normal alignment.  No subluxations. Skull base and vertebrae: No acute fracture. No primary bone lesion or focal pathologic process. Soft tissues and spinal canal: No prevertebral fluid or swelling. No visible canal hematoma. Disc levels: Facet degenerative changes throughout the cervical spine. Moderate anterior and mild to moderate posterior spur formation and posterior disc bulging at the C6-7 level. Upper chest: Mild biapical pleural and parenchymal scarring. Other: None. IMPRESSION: 1. No maxillofacial fracture. 2. No cervical spine fracture or subluxation. 3. Multilevel cervical spine degenerative changes. 4. Mild chronic bilateral maxillary sinusitis. Electronically Signed   By: Claudie Revering M.D.   On: 11/05/2016 20:06   Mr Brain Limited Wo Contrast  Result Date: 11/05/2016 CLINICAL DATA:  [place] today, confusion.  History of breast cancer. EXAM: MRI HEAD WITHOUT CONTRAST TECHNIQUE: Multiplanar, multiecho pulse sequences of the brain and surrounding structures were obtained without intravenous contrast. Axial T1 and coronal T2 sequences not obtained. Nondiagnostic gradient sequence. COMPARISON:  CT HEAD November 05, 2016 1814 hours FINDINGS: Sequences are moderately or severely motion degraded, patient had difficulty remaining still, and removed herself from the scanner. BRAIN: No reduced diffusion to suggest acute ischemia or hypercellular tumor. The ventricles and sulci are normal for patient's age. No suspicious parenchymal signal, masses or mass effect. LEFT internal  capsule perivascular space. No abnormal extra-axial fluid collections. VASCULAR: Normal major intracranial vascular flow voids present at skull base. SKULL AND UPPER CERVICAL SPINE: No abnormal sellar expansion. No suspicious calvarial bone marrow signal. Craniocervical junction maintained. SINUSES/ORBITS: The mastoid air-cells and included paranasal sinuses are well-aerated. The included ocular globes and orbital contents are non-suspicious. Status post bilateral ocular lens implants OTHER: None. IMPRESSION: Limited motion degraded noncontrast MRI head without acute intracranial process. Electronically Signed   By: Elon Alas M.D.   On: 11/05/2016 22:00   Ct Maxillofacial Wo Contrast  Result Date: 11/05/2016 CLINICAL DATA:  Altered mental status following a fall today. The patient hit her face in the fall. EXAM: CT MAXILLOFACIAL WITHOUT CONTRAST CT CERVICAL SPINE WITHOUT CONTRAST TECHNIQUE: Multidetector CT imaging of the maxillofacial structures was performed. Multiplanar CT image reconstructions were also generated. A small metallic BB was placed on the right temple in order to reliably differentiate right from left. Multidetector CT imaging of the cervical spine was performed without intravenous contrast. Multiplanar CT image reconstructions were also generated. COMPARISON:  Head CT obtained earlier today. FINDINGS: CT MAXILLOFACIAL FINDINGS Osseous: The patient moved during the examination with resolved an artifacts, including reconstruction artifacts. Taking this into account, no fractures are seen. Orbits: Reconstruction artifacts with no fracture seen. Sinuses: Minimal bilateral maxillary sinus mucosal thickening. No fluid. Soft tissues: Unremarkable. Limited intracranial: No intracranial hemorrhage. CT CERVICAL FINDINGS Alignment: Normal alignment.  No subluxations. Skull base and vertebrae: No acute fracture. No primary bone lesion or focal pathologic process. Soft tissues and spinal canal: No  prevertebral fluid or swelling. No visible canal hematoma. Disc levels: Facet degenerative changes throughout the cervical spine. Moderate anterior and mild to moderate posterior spur formation and posterior disc bulging at the C6-7 level. Upper chest: Mild biapical pleural and parenchymal scarring. Other: None. IMPRESSION: 1. No maxillofacial fracture. 2. No cervical spine fracture or subluxation. 3. Multilevel cervical spine degenerative changes. 4. Mild chronic bilateral maxillary sinusitis. Electronically Signed   By: Remo Lipps  Joneen Caraway M.D.   On: 11/05/2016 20:06    Assessment: 1. Persistent expressive and receptive dysphasia, with waxing and waning component. Occurring after a fall with facial trauma. MRI negative for stroke. DDx includes unusual presentation of post-concussive syndrome, conversion disorder secondary to emotional stress from the trauma, and unusual manifestation of seizure. AST and ALT slightly elevated but essentially unremarkable. Ammonia level normal.  2. Unsteady gait. Parkinsonian-like with a possible component of spasticity. DDx includes B12 deficiency myelopathy.   Recommendations: 1. Vitamin B12 level.  2. Thiamine level.  3. TSH. 4. EEG.  5. Speech pathology evaluation.  6. Regarding her gait, will need an outpatient movement disorders evaluation at [facility], as well as an MRI of her cervical and thoracic spine.   Electronically signed: Dr. Kerney Elbe 11/06/2016, 11:05 AM

## 2016-11-06 NOTE — Progress Notes (Signed)
Pt woke up from a nap and is alert and orientated x 4. MD aware.

## 2016-11-06 NOTE — Procedures (Signed)
EEG Report  Clinical History:  Waxing and waning aphasia s/p fall with facial trauma.  Technical Summary:  A 19 channel digital EEG recording was performed using the 10-20 international system of electrode placement.  Bipolar and Referential montages were used.  The total recording time was approx 20 minutes.  Findings:  There is a no posterior dominant alpha rhythm with eye closure. Background frequencies are about 6 Hz and symmetrical.   No focal slowing is present. No epileptiform discharges or electrographic seizures are present.    Impression:  This is an abnormal EEG. There is moderate generalized slowing of brain activity.  This is non-specific, but may be due to underlying dementia, a post-ictal phenomenon after a seizure, or sequelae of a toxic, infectious, or metabolic encephalopathy.  Clinical correlation is advised.  The patient is not in non-convulsive status epilepticus.    Rogue Jury, MS, MD

## 2016-11-06 NOTE — Evaluation (Signed)
Physical Therapy Evaluation Patient Details Name: Diana Hall MRN: [id number] DOB: 1945-10-01 Today's Date: 11/06/2016   History of Present Illness  Diana Hall is an 71 y.o. female who presented to [facility] on Thursday evening for assessment of abnormal speech beginning approximately 30 minutes after striking her face during a fall at 4:15 PM. The fall occurred while walking out of a store. The pavement she was on was sloping/irregular and it is felt by her husband that she most likely tripped  although he did not witness the fall; MRI neg  Clinical Impression    Pt admitted with above diagnosis. Pt currently with functional limitations due to the deficits listed below (see PT Problem List). * Pt will benefit from skilled PT to increase their independence and safety with mobility to allow discharge to the venue listed below.     Follow Up Recommendations Supervision/Assistance - 24 hour;Home health PT;No PT follow up (depending on progress)    Equipment Recommendations  Other (comment) (TBD)    Recommendations for Other Services       Precautions / Restrictions Precautions Precautions: Fall Restrictions Weight Bearing Restrictions: No      Mobility  Bed Mobility Overal bed mobility: Needs Assistance Bed Mobility: Supine to Sit;Sit to Supine     Supine to sit: Min guard Sit to supine: Supervision   General bed mobility comments: tactile cues/facilitation to bring LEs off bed and trunk to upright; cues for completion of task, incr time  Transfers Overall transfer level: Needs assistance Equipment used: None Transfers: Sit to/from Stand Sit to Stand: Min assist         General transfer comment: assist to rise and stabilize, cues for safety awareness  Ambulation/Gait Ambulation/Gait assistance: Min assist Ambulation Distance (Feet): 120 Feet Assistive device: None Gait Pattern/deviations: Step-through pattern;Decreased stride length;Ataxic;Festinating;Drifts  right/left     General Gait Details: pt with no overt LOB but gait is unsteady; she reaches for rail in hallway repeatedly to steady herself; she drifts and is mildly festinating at times, crosses hands during gait  Stairs            Wheelchair Mobility    Modified Rankin (Stroke Patients Only)       Balance Overall balance assessment: Needs assistance Sitting-balance support: No upper extremity supported;Feet supported Sitting balance-Leahy Scale: Fair     Standing balance support: No upper extremity supported;During functional activity Standing balance-Leahy Scale: Fair               High level balance activites: Backward walking;Direction changes;Turns;Head turns High Level Balance Comments: min/guard for balance during head turns             Pertinent Vitals/Pain Pain Assessment: Faces Faces Pain Scale: No hurt    Home Living Family/patient expects to be discharged to:: Private residence Living Arrangements: Spouse/significant other   Type of Home: House       Home Layout: Multi-level;Bed/bath upstairs Home Equipment: Kasandra Knudsen - single point;Walker - 2 wheels;Bedside commode;Walker - 4 wheels Additional Comments: donated DME from relatives    Prior Function Level of Independence: Independent         Comments: per pt husband she is normally independent, drives, goes out to lunch with friends, etc; later pt husband states "she sleeps a lot"     Hand Dominance        Extremity/Trunk Assessment   Upper Extremity Assessment Upper Extremity Assessment: Overall WFL for tasks assessed    Lower Extremity Assessment Lower Extremity Assessment:  Generalized weakness;RLE deficits/detail;LLE deficits/detail RLE Coordination: decreased gross motor LLE Coordination: decreased gross motor       Communication   Communication: No difficulties  Cognition Arousal/Alertness: Awake/alert Behavior During Therapy: WFL for tasks assessed/performed Overall  Cognitive Status: Impaired/Different from baseline Area of Impairment: Orientation;Following commands;Safety/judgement                 Orientation Level: Disoriented to;Place;Time;Situation     Following Commands: Follows one step commands inconsistently;Follows multi-step commands inconsistently Safety/Judgement: Decreased awareness of safety;Decreased awareness of deficits     General Comments: pt is unable to state day of week, month or year; she cannot report what her husband did for a living, does not know where she is and with significant word finding difficulty--speaking gibberish  repeatedly      General Comments      Exercises     Assessment/Plan    PT Assessment Patient needs continued PT services  PT Problem List Decreased strength;Decreased activity tolerance;Decreased balance;Decreased coordination;Decreased cognition       PT Treatment Interventions DME instruction;Gait training;Functional mobility training;Therapeutic activities;Therapeutic exercise;Patient/family education    PT Goals (Current goals can be found in the Care Plan section)  Acute Rehab PT Goals Patient Stated Goal: none stated PT Goal Formulation: With patient/family Time For Goal Achievement: 11/13/16 Potential to Achieve Goals: Good    Frequency Min 3X/week   Barriers to discharge        Co-evaluation               AM-PAC PT "6 Clicks" Daily Activity  Outcome Measure Difficulty turning over in bed (including adjusting bedclothes, sheets and blankets)?: Total Difficulty moving from lying on back to sitting on the side of the bed? : Total Difficulty sitting down on and standing up from a chair with arms (e.g., wheelchair, bedside commode, etc,.)?: Total Help needed moving to and from a bed to chair (including a wheelchair)?: A Little Help needed walking in hospital room?: A Little Help needed climbing 3-5 steps with a railing? : A Lot 6 Click Score: 11    End of Session  Equipment Utilized During Treatment: Gait belt Activity Tolerance: Patient tolerated treatment well Patient left: in bed;with call bell/phone within reach;with bed alarm set Nurse Communication: Mobility status PT Visit Diagnosis: Unsteadiness on feet (R26.81)    Time: 6606-3016 PT Time Calculation (min) (ACUTE ONLY): 24 min   Charges:   PT Evaluation $PT Eval Moderate Complexity: 1 Procedure PT Treatments $Gait Training: 8-22 mins   PT G Codes:   PT G-Codes **NOT FOR INPATIENT CLASS** Functional Assessment Tool Used: AM-PAC 6 Clicks Basic Mobility;Clinical judgement Functional Limitation: Mobility: Walking and moving around Mobility: Walking and Moving Around Current Status ([id number]): At least 20 percent but less than 40 percent impaired, limited or restricted Mobility: Walking and Moving Around Goal Status [phone]): At least 1 percent but less than 20 percent impaired, limited or restricted    Kenyon Ana, PT Pager: [phone] 11/06/2016   Kenyon Ana 11/06/2016, 2:03 PM

## 2016-11-06 NOTE — Progress Notes (Signed)
EEG completed, results pending. 

## 2016-11-07 LAB — CBC
HCT: 34.7 % — ABNORMAL LOW (ref 36.0–46.0)
HEMOGLOBIN: 11.2 g/dL — AB (ref 12.0–15.0)
MCH: 26.2 pg (ref 26.0–34.0)
MCHC: 32.3 g/dL (ref 30.0–36.0)
MCV: 81.1 fL (ref 78.0–100.0)
Platelets: 164 10*3/uL (ref 150–400)
RBC: 4.28 MIL/uL (ref 3.87–5.11)
RDW: 15.2 % (ref 11.5–15.5)
WBC: 7 10*3/uL (ref 4.0–10.5)

## 2016-11-07 LAB — COMPREHENSIVE METABOLIC PANEL
ALBUMIN: 3.4 g/dL — AB (ref 3.5–5.0)
ALK PHOS: 56 U/L (ref 38–126)
ALT: 52 U/L (ref 14–54)
AST: 45 U/L — ABNORMAL HIGH (ref 15–41)
Anion gap: 9 (ref 5–15)
BUN: 13 mg/dL (ref 6–20)
CALCIUM: 8.8 mg/dL — AB (ref 8.9–10.3)
CO2: 27 mmol/L (ref 22–32)
CREATININE: 0.78 mg/dL (ref 0.44–1.00)
Chloride: 106 mmol/L (ref 101–111)
GFR calc Af Amer: >60 mL/min (ref >60)
GFR calc non Af Amer: >60 mL/min (ref >60)
GLUCOSE: 186 mg/dL — AB (ref 65–99)
Potassium: 3.9 mmol/L (ref 3.5–5.1)
SODIUM: 142 mmol/L (ref 135–145)
Total Bilirubin: 0.6 mg/dL (ref 0.3–1.2)
Total Protein: 6.3 g/dL — ABNORMAL LOW (ref 6.5–8.1)

## 2016-11-07 LAB — URINE CULTURE

## 2016-11-07 LAB — GLUCOSE, CAPILLARY
Glucose-Capillary: 192 mg/dL — ABNORMAL HIGH (ref 65–99)
Glucose-Capillary: 350 mg/dL — ABNORMAL HIGH (ref 65–99)

## 2016-11-07 LAB — HEMOGLOBIN A1C
Hgb A1c MFr Bld: 9.2 % — ABNORMAL HIGH (ref 4.8–5.6)
Mean Plasma Glucose: 217 mg/dL

## 2016-11-07 MED ORDER — CEFUROXIME AXETIL 250 MG PO TABS: 250.0000 mg | ORAL_TABLET | Freq: Two times a day (BID) | ORAL | 0 refills | Status: AC

## 2016-11-07 NOTE — Discharge Instructions (Signed)

## 2016-11-07 NOTE — Discharge Summary (Addendum)
Physician Discharge Summary  Diana Hall WCB:[id number] DOB: 1946-01-12 DOA: 11/05/2016  PCP: System, Provider Not In  Admit date: 11/05/2016 Discharge date: 11/07/2016  Time spent: 45 minutes  Recommendations for Outpatient Follow-up:  Patient will be discharged to home with home health physical therapy.  Patient will need to follow up with primary care provider within one week of discharge, Repeat CMP.  Patient should continue medications as prescribed. Follow up with [facility]. Patient should follow a heart healthy/carb modified diet.   Discharge Diagnoses:  Acute encephalopathy Fall Asymptomatic urinary tract infection Leukocytosis/tachycardia, SIRS vs Sepsis Acute kidney injury versus chronic kidney disease Diabetes Mellitus, type II Essential hypertension History of breast cancer Depression Hyperlipidemia Elevated transaminases  Discharge Condition: Stable  Diet recommendation:  heart healthy/carb modified diet  Filed Weights   11/07/16 0600  Weight: 72.6 kg (160 lb)    History of present illness:  on 11/05/2016 by Dr. Dahlia Byes Seymouris a 71 y.o.femalewith medical history significant of HTN, DM, and remote h/o breast cancer presenting with confusion after she fell today about 415. Husband was not there, but her grandson was with her. EMS checked her out, she was lucid and did not appear to have issues other than scrapes on her face. When they got her home, she developed difficulty speaking, could not understand her husband, wanted to sleep. She alternates being very lucid, following instructions with being out of it, complete aphasia, speaks only syllables without words. Physically restless. This morning, she did complain of being dizzy - happens occasionally, not debilitating. She did let her husband drive, which she never does. In general, she sleeps a lot and is lethargic.  Hospital Course:  Acute encephalopathy, possibly  concussion -unknown etiology -Suspected TIA vs CVA however, CT head and MRI brain showed no acute intracranial hemorrhage or skull fracture.  -Patient noted to have possible urinary tract infection, UA showed TNTC WBC, large leukocytes, rare bacteria -Chest x-ray unremarkable -Question whether this possibly is due to seizure versus concussion versus possible CVA as MRI was limited due to motion versus infection. -Ammonia 17, TSH 0.860, B12 430, Folate 37.9 (all WNL) -Continue statin and aspirin  -neurology consulted and appreciated, found patient to have persistent expressive and receptive dysphasia, waxing and waning component. DDX postconcussive syndrome, conversion disorder secondary to emotional stress from trauma, unusual manifestation of seizure -EEG obtained: No elective form discharges or electrographic seizures present, EEG was abnormal with moderate generalized slowing of brain activity -Neurology recommended outpatient follow-up with [facility] as well as MRI of cervical and thoracic spine -Patient currently alert and oriented 4, no neurological deficits noted at this time.  Fall -patient tripped -PT consulted and recommended home health  Asymptomatic urinary tract infection -UA as noted above -Urine culture: multiple species -Patient currently afebrile, but with leukocytosis -Was placed on ceftriaxone -Will discharge with ceftin  Leukocytosis/tachycardia, SIRS vs Sepsis -Resolved, WBC down to 7 -likely secondary to the above vs infection -Continue treatment and plan as above  Acute kidney injury versus chronic kidney disease -Upon admission, creatinine 1.37, currently 0.78 -Upon review of Epic, patient's creatinine was normal up until 2012, 0.7. However no labs in the past 6 years. Patient does have history of diabetes and hypertension, therefore she may have CKD, however, her creatinine improved, currently 0.78 -Repeat CMP in one  week  Diabetes Mellitus, type II -glipizide held, but may resume on discharge -patient states she is on metformin 500mg  BID at home as well -hemoglobin  A1c 9.2 -Does not want medications changed, she will follow up with her PCP   Essential hypertension -Benazepril held given renal function -Restart home meds at discharge  History of breast cancer -s/p right sided  Depression -Continue cymbalta  Hyperlipidemia -Statin held, restart at discharge -Repeat CMP   Elevated transaminases -Appears to be chronic, patient's AST and ALT elevated, have been so since 2009 per review of Epic  Code Status: DNR  Consultants Neurology  Procedures  EEG  Discharge Exam: Vitals:   11/06/16 2222 11/07/16 0623  BP: (!) 142/80 (!) 151/71  Pulse: 80 75  Resp: 17 19  Temp: 98.3 F (36.8 C) 97.4 F (36.3 C)   Patient Denies chest pain, shortness breath, abdominal pain, nausea or vomiting, diarrhea or constipation, headache or dizziness.   General: Well developed, well nourished, NAD, appears stated age  80: [place], facial trauma, right orbital erythema, mucous membranes moist.  Cardiovascular: S1 S2 auscultated, no rubs, murmurs or gallops. Regular rate and rhythm.  Respiratory: Clear to auscultation bilaterally with equal chest rise  Abdomen: Soft, nontender, nondistended, + bowel sounds  Extremities: warm dry without cyanosis clubbing or edema  Neuro: AAOx3, nonfocal  Psych: Normal affect and demeanor with intact judgement and insight  Discharge Instructions Discharge Instructions    Discharge instructions    Complete by:  As directed    Patient will be discharged to home with home health physical therapy.  Patient will need to follow up with primary care provider within one week of discharge, Repeat CMP.  Patient should continue medications as prescribed. Follow up with [facility]. Patient should follow a heart healthy/carb modified diet.      Current Discharge Medication List    START taking these medications   Details  cefUROXime (CEFTIN) 250 MG tablet Take 1 tablet (250 mg total) by mouth 2 (two) times daily. Qty: 6 tablet, Refills: 0      CONTINUE these medications which have NOT CHANGED   Details  aspirin 81 MG tablet Take 81 mg by mouth daily.    benazepril (LOTENSIN) 20 MG tablet Take 20 mg by mouth daily.    DULoxetine (CYMBALTA) 60 MG capsule Take 60 mg by mouth daily.    fish oil-omega-3 fatty acids 1000 MG capsule Take 1 g by mouth 2 (two) times daily.    glipiZIDE (GLUCOTROL XL) 10 MG 24 hr tablet Take 10 mg by mouth daily.    loratadine (CLARITIN) 10 MG tablet Take 10 mg by mouth daily.    loteprednol (LOTEMAX) 0.2 % SUSP 1 drop 4 (four) times daily.    Multiple Vitamin (MULTIVITAMIN) capsule Take 1 capsule by mouth daily.    omeprazole (PRILOSEC) 20 MG capsule Take 20 mg by mouth daily.    pravastatin (PRAVACHOL) 40 MG tablet Take 40 mg by mouth daily.    traZODone (DESYREL) 100 MG tablet Take 100 mg by mouth at bedtime.       Allergies  Allergen Reactions  . Iodinated Diagnostic Agents Anaphylaxis  . Penicillins Rash  . Hydrocodone-Acetaminophen Itching   Follow-up Information    Primary care physician. Schedule an appointment as soon as possible for a visit in 1 week(s).   Why:  Hospital follow-up       [facility]. Schedule an appointment as soon as possible for a visit in 1 week(s).   Specialty:  Neurology Why:  Hospital follow-up Contact information: [address] [place] [phone]  The results of significant diagnostics from this hospitalization (including imaging, microbiology, ancillary and laboratory) are listed below for reference.    Significant Diagnostic Studies: Dg Chest 2 View  Result Date: 11/05/2016 CLINICAL DATA:  Altered mental status after fall. EXAM: CHEST  2 VIEW COMPARISON:   12/20/2014 FINDINGS: The heart size and mediastinal contours are within normal limits. There is aortic atherosclerosis at the arch. There is a moderate-sized hiatal hernia projecting over the cardiac silhouette, stable in appearance since 2016 exam. Both lungs are clear. Axillary clips are seen on the right. The visualized skeletal structures are unremarkable. IMPRESSION: 1. No active cardiopulmonary disease. 2. Aortic atherosclerosis. 3. Moderate-sized hiatal hernia, stable in appearance. Electronically Signed   By: Ashley Royalty M.D.   On: 11/05/2016 19:50   Ct Head Wo Contrast  Result Date: 11/05/2016 CLINICAL DATA:  Altered mental status following a fall today. EXAM: CT HEAD WITHOUT CONTRAST TECHNIQUE: Contiguous axial images were obtained from the base of the skull through the vertex without intravenous contrast. COMPARISON:  Brain MR report dated 03/22/2011. FINDINGS: Brain: Diffusely enlarged ventricles and subarachnoid spaces. Patchy white matter low density in both cerebral hemispheres. No intracranial hemorrhage, mass lesion or CT evidence of acute infarction. Vascular: No hyperdense vessel or unexpected calcification. Skull: Normal. Negative for fracture or focal lesion. Sinuses/Orbits: Status post bilateral cataract removal. Normally pneumatized included paranasal sinuses. Other: None. IMPRESSION: 1. No skull fracture or intracranial hemorrhage. 2. Mild diffuse cerebral and cerebellar atrophy and minimal chronic small vessel white matter ischemic changes in both cerebral hemispheres. Electronically Signed   By: Claudie Revering M.D.   On: 11/05/2016 19:13   Ct Cervical Spine Wo Contrast  Result Date: 11/05/2016 CLINICAL DATA:  Altered mental status following a fall today. The patient hit her face in the fall. EXAM: CT MAXILLOFACIAL WITHOUT CONTRAST CT CERVICAL SPINE WITHOUT CONTRAST TECHNIQUE: Multidetector CT imaging of the maxillofacial structures was performed. Multiplanar CT image reconstructions  were also generated. A small metallic BB was placed on the right temple in order to reliably differentiate right from left. Multidetector CT imaging of the cervical spine was performed without intravenous contrast. Multiplanar CT image reconstructions were also generated. COMPARISON:  Head CT obtained earlier today. FINDINGS: CT MAXILLOFACIAL FINDINGS Osseous: The patient moved during the examination with resolved an artifacts, including reconstruction artifacts. Taking this into account, no fractures are seen. Orbits: Reconstruction artifacts with no fracture seen. Sinuses: Minimal bilateral maxillary sinus mucosal thickening. No fluid. Soft tissues: Unremarkable. Limited intracranial: No intracranial hemorrhage. CT CERVICAL FINDINGS Alignment: Normal alignment.  No subluxations. Skull base and vertebrae: No acute fracture. No primary bone lesion or focal pathologic process. Soft tissues and spinal canal: No prevertebral fluid or swelling. No visible canal hematoma. Disc levels: Facet degenerative changes throughout the cervical spine. Moderate anterior and mild to moderate posterior spur formation and posterior disc bulging at the C6-7 level. Upper chest: Mild biapical pleural and parenchymal scarring. Other: None. IMPRESSION: 1. No maxillofacial fracture. 2. No cervical spine fracture or subluxation. 3. Multilevel cervical spine degenerative changes. 4. Mild chronic bilateral maxillary sinusitis. Electronically Signed   By: Claudie Revering M.D.   On: 11/05/2016 20:06   Mr Brain Limited Wo Contrast  Result Date: 11/05/2016 CLINICAL DATA:  [place] today, confusion.  History of breast cancer. EXAM: MRI HEAD WITHOUT CONTRAST TECHNIQUE: Multiplanar, multiecho pulse sequences of the brain and surrounding structures were obtained without intravenous contrast. Axial T1 and coronal T2 sequences not obtained. Nondiagnostic gradient sequence. COMPARISON:  CT  HEAD November 05, 2016 1814 hours FINDINGS: Sequences are moderately or  severely motion degraded, patient had difficulty remaining still, and removed herself from the scanner. BRAIN: No reduced diffusion to suggest acute ischemia or hypercellular tumor. The ventricles and sulci are normal for patient's age. No suspicious parenchymal signal, masses or mass effect. LEFT internal capsule perivascular space. No abnormal extra-axial fluid collections. VASCULAR: Normal major intracranial vascular flow voids present at skull base. SKULL AND UPPER CERVICAL SPINE: No abnormal sellar expansion. No suspicious calvarial bone marrow signal. Craniocervical junction maintained. SINUSES/ORBITS: The mastoid air-cells and included paranasal sinuses are well-aerated. The included ocular globes and orbital contents are non-suspicious. Status post bilateral ocular lens implants OTHER: None. IMPRESSION: Limited motion degraded noncontrast MRI head without acute intracranial process. Electronically Signed   By: Elon Alas M.D.   On: 11/05/2016 22:00   Ct Maxillofacial Wo Contrast  Result Date: 11/05/2016 CLINICAL DATA:  Altered mental status following a fall today. The patient hit her face in the fall. EXAM: CT MAXILLOFACIAL WITHOUT CONTRAST CT CERVICAL SPINE WITHOUT CONTRAST TECHNIQUE: Multidetector CT imaging of the maxillofacial structures was performed. Multiplanar CT image reconstructions were also generated. A small metallic BB was placed on the right temple in order to reliably differentiate right from left. Multidetector CT imaging of the cervical spine was performed without intravenous contrast. Multiplanar CT image reconstructions were also generated. COMPARISON:  Head CT obtained earlier today. FINDINGS: CT MAXILLOFACIAL FINDINGS Osseous: The patient moved during the examination with resolved an artifacts, including reconstruction artifacts. Taking this into account, no fractures are seen. Orbits: Reconstruction artifacts with no fracture seen. Sinuses: Minimal bilateral maxillary sinus  mucosal thickening. No fluid. Soft tissues: Unremarkable. Limited intracranial: No intracranial hemorrhage. CT CERVICAL FINDINGS Alignment: Normal alignment.  No subluxations. Skull base and vertebrae: No acute fracture. No primary bone lesion or focal pathologic process. Soft tissues and spinal canal: No prevertebral fluid or swelling. No visible canal hematoma. Disc levels: Facet degenerative changes throughout the cervical spine. Moderate anterior and mild to moderate posterior spur formation and posterior disc bulging at the C6-7 level. Upper chest: Mild biapical pleural and parenchymal scarring. Other: None. IMPRESSION: 1. No maxillofacial fracture. 2. No cervical spine fracture or subluxation. 3. Multilevel cervical spine degenerative changes. 4. Mild chronic bilateral maxillary sinusitis. Electronically Signed   By: Claudie Revering M.D.   On: 11/05/2016 20:06    Microbiology: Recent Results (from the past 240 hour(s))  Urine Culture     Status: Abnormal   Collection Time: 11/05/16  7:13 PM  Result Value Ref Range Status   Specimen Description URINE, RANDOM  Final   Special Requests NONE  Final   Culture MULTIPLE SPECIES PRESENT, SUGGEST RECOLLECTION (A)  Final   Report Status 11/07/2016 FINAL  Final     Labs: Basic Metabolic Panel:  Recent Labs Lab 11/05/16 1805 11/06/16 0205 11/06/16 1138 11/07/16 0530  NA 137 138  --  142  K 3.5 4.0  --  3.9  CL 100* 104  --  106  CO2 25 23  --  27  GLUCOSE 314* 262*  --  186*  BUN 20 21*  --  13  CREATININE 1.37* 1.23*  --  0.78  CALCIUM 9.4 8.9  --  8.8*  MG  --   --  1.0*  --    Liver Function Tests:  Recent Labs Lab 11/05/16 1805 11/07/16 0530  AST 48* 45*  ALT 69* 52  ALKPHOS 79 56  BILITOT 0.9 0.6  PROT 8.1 6.3*  ALBUMIN 4.6 3.4*   No results for input(s): LIPASE, AMYLASE in the last 168 hours.  Recent Labs Lab 11/06/16 0205  AMMONIA 17   CBC:  Recent Labs Lab 11/05/16 1805 11/06/16 0205 11/07/16 0530  WBC 13.3*  14.7* 7.0  HGB 13.7 13.6 11.2*  HCT 40.0 40.3 34.7*  MCV 78.6 79.3 81.1  PLT 239 159 164   Cardiac Enzymes: No results for input(s): CKTOTAL, CKMB, CKMBINDEX, TROPONINI in the last 168 hours. BNP: BNP (last 3 results) No results for input(s): BNP in the last 8760 hours.  ProBNP (last 3 results) No results for input(s): PROBNP in the last 8760 hours.  CBG:  Recent Labs Lab 11/06/16 1213 11/06/16 1704 11/06/16 2234 11/07/16 0735 11/07/16 1119  GLUCAP 162* 167* 180* 192* 350*       Signed:  Rhea Kaelin  [facility] 11/07/2016, 12:18 PM

## 2016-12-18 ENCOUNTER — Ambulatory Visit: Payer: Self-pay | Admitting: Neurology

## 2017-07-21 ENCOUNTER — Other Ambulatory Visit: Payer: Self-pay | Admitting: Family Medicine

## 2017-07-21 DIAGNOSIS — Z1231 Encounter for screening mammogram for malignant neoplasm of breast: Secondary | ICD-10-CM

## 2017-08-11 ENCOUNTER — Ambulatory Visit: Payer: Medicare Other

## 2017-10-12 ENCOUNTER — Ambulatory Visit
Admission: RE | Admit: 2017-10-12 | Discharge: 2017-10-12 | Disposition: A | Discharge status: Home / Self Care | Payer: Medicare Other | Source: Ambulatory Visit | Attending: Family Medicine | Admitting: Family Medicine

## 2017-10-12 DIAGNOSIS — Z1231 Encounter for screening mammogram for malignant neoplasm of breast: Secondary | ICD-10-CM

## 2017-11-19 ENCOUNTER — Encounter: Payer: Self-pay | Admitting: *Deleted

## 2017-11-21 ENCOUNTER — Encounter: Payer: Self-pay | Admitting: Neurology

## 2017-11-22 ENCOUNTER — Ambulatory Visit: Payer: Medicare Other | Admitting: Neurology

## 2017-11-22 ENCOUNTER — Other Ambulatory Visit: Payer: Self-pay

## 2017-11-22 ENCOUNTER — Encounter: Payer: Self-pay | Admitting: Neurology

## 2017-11-22 VITALS — BP 144/84 | HR 111 | Resp 16 | Ht 63.0 in | Wt 149.0 lb

## 2017-11-22 DIAGNOSIS — R413 Other amnesia: Secondary | ICD-10-CM

## 2017-11-22 DIAGNOSIS — R4701 Aphasia: Secondary | ICD-10-CM

## 2017-11-22 DIAGNOSIS — E538 Deficiency of other specified B group vitamins: Secondary | ICD-10-CM

## 2017-11-22 DIAGNOSIS — R4189 Other symptoms and signs involving cognitive functions and awareness: Secondary | ICD-10-CM | POA: Diagnosis not present

## 2017-11-22 DIAGNOSIS — F0390 Unspecified dementia without behavioral disturbance: Secondary | ICD-10-CM

## 2017-11-22 NOTE — Patient Instructions (Signed)
Lab tests MRI brain FDG PET Scan for Dementia Diana Hall for formal neurocognitive testing   Aphasia Aphasia is damage to the part of your brain that you need to communicate. For most people, that area is on the left side of the brain. Aphasia does not affect your intelligence, but you may struggle to talk, understand speech, read, or write. Aphasia can happen to anyone at any age, but it is most common in older age. What are the causes? An interruption of blood supply to the brain (stroke) is the most common cause of aphasia. Any disease or disorder that damages the communication areas of the brain can cause aphasia. This includes:  Brain tumors.  Brain injuries.  Brain infections.  Progressive diseases of the nervous system (neurological disorders).  What increases the risk? You may be at risk for aphasia if you have had any trauma, disease, or disorder that damaged the communication areas of the brain. What are the signs or symptoms? Aphasia may start suddenly if it is caused by a stroke or brain injury. Aphasia caused by a tumor or a progressive neurological disorder may start gradually. The condition affects people differently. Signs and symptoms of aphasia include:  Trouble finding the right word.  Using the wrong words.  Talking in sentences that do not make sense.  Making up words.  Being unable to understand other people's speech.  Having problems writing, spelling, or reading.  Having trouble with numbers.  Having trouble swallowing.  How is this diagnosed? Your health care provider may suspect you have aphasia if you lose the ability to speak or understand language. You may need to see a specialist (speech and language pathologist) to help determine the diagnosis of aphasia. This person may do a series of tests to check your ability to:  Speak.  Express ideas.  Make conversation.  Understand speech.  Read and write.  How is this treated? In some  cases, aphasia may improve on its own over time. Treatment for aphasia usually involves therapy with a pathologist. Your treatment will be designed to meet your needs and abilities. Common treatments include:  Speech therapy.  Learning other ways to communicate.  Working with family members to find the best ways to communicate.  Working with an occupational therapist to find ways to communicate at work.  Follow these instructions at home:  Keep all follow-up appointments.  Make sure you have a good support system at home.  The following techniques may be helpful while communicating: ? Use short, simple sentences. Ask family members to do the same. Sentences that require one-word answers are easiest. ? Avoid distractions like background noise when trying to listen or talk. ? Try communicating with gestures, pointing, or drawing. ? Talk slowly. Ask family members to talk to you slowly. ? Maintain eye contact when communicating. Contact a health care provider if:  Your symptoms change or get worse.  You need more support at home.  You are struggling with anxiety or depression.  You develop trouble swallowing. This information is not intended to replace advice given to you by your health care provider. Make sure you discuss any questions you have with your health care provider. Document Released: 01/31/2002 Document Revised: 11/29/2015 Document Reviewed: 07/31/2013 Elsevier Interactive Patient Education  2018 [employer].

## 2017-11-22 NOTE — Progress Notes (Addendum)
[facility]    Provider:  Dr Jaynee Eagles Referring Provider: Deloria Lair., MD Primary Care Physician:  Sandi Mealy, MD  CC:  aphasia  HPI:  Diana Hall is a 72 y.o. female here as a referral from Dr. Scotty Court for aphasia. PMHx breast cancer,, depression, HTN, HLD, DM.  A [place] [employer]. She was flat on her face on the cement, fell and hit her head, mechanical fall in the setting of dizziness and a UTI. She was diagnosed with a concussion. She was not diagnosed with a seizure. Since then she has had problems remembering things and aphasia. She improved afterwards for several months but then stopped improving, symptoms stable. Recently she tried to order hot dogs, she couldn;t "get it out", she knew what she wanted to say and she couldn;t get it out, instead she described what she wanted instead of saying hot dog. She mispronounces words she knows. Handwriting is worsening, her hands shake and difficulty spelling the words and this had worsened. Father had Alzheimers in his 37s. She also has short term memory, forgetting where she puts things, she has to use a calendar to find appointments, she has to ask multiple times in the same day about appointments progressive decline over the last year, She uses a pill dispenser to help with medication. She is driving no accidents. She will forget things she has to buy at the store. Difficulty with paying bills, husband took over the bills. She does not snore and denies excessive fatigue. Husband here and provides much information.  Reviewed notes, labs and imaging from outside physicians, which showed:  Mri 10/2016: personally reviewed images unremarkable but limited  Reviewed emergency room notes from June 2018.  She has a history of breast cancer presented with altered mental status and a fall.  She had a fall and landed on her face.  She suffered abrasion to the right side of the face in both of her hands.  Patient  was dizzy prior, after the fall patient was promptly evaluated by EMS.  They released her.  Once patient got home she became increasingly altered.  Husband said speech not making any sense.  Emergency room noted her affect was an appropriate her speech was tangential and she was inattentive, alert only to self, unable to tell situation month or year, dysarthric speech, and poor coordination. She was diagnosed with UTI and treated with antibiotics.     Review of Systems: Patient complains of symptoms per HPI as well as the following symptoms: aphasia. Pertinent negatives and positives per HPI. All others negative.   Social History   Socioeconomic History  . Marital status: Married    Spouse name: Not on file  . Number of children: Not on file  . Years of education: Not on file  . Highest education level: Not on file  Occupational History  . Occupation: Retired  Scientific laboratory technician  . Financial resource strain: Not on file  . Food insecurity:    Worry: Not on file    Inability: Not on file  . Transportation needs:    Medical: Not on file    Non-medical: Not on file  Tobacco Use  . Smoking status: Never Smoker  . Smokeless tobacco: Never Used  Substance and Sexual Activity  . Alcohol use: Yes    Comment: rare  . Drug use: No  . Sexual activity: Not on file  Lifestyle  . Physical activity:    Days per week:  Not on file    Minutes per session: Not on file  . Stress: Not on file  Relationships  . Social connections:    Talks on phone: Not on file    Gets together: Not on file    Attends religious service: Not on file    Active member of club or organization: Not on file    Attends meetings of clubs or organizations: Not on file    Relationship status: Not on file  . Intimate partner violence:    Fear of current or ex partner: Not on file    Emotionally abused: Not on file    Physically abused: Not on file    Forced sexual activity: Not on file  Other Topics Concern  . Not on  file  Social History Narrative  . Not on file    Family History  Problem Relation Age of Onset  . Breast cancer Mother 1  . Alzheimer's disease Father     Past Medical History:  Diagnosis Date  . Allergic rhinitis   . Benign essential hypertension   . Breast cancer, stage 1 ([place])   . Cancer [facility]) 2002   breast cancer  . Diabetes mellitus type 2 in obese ([place])   . Dyslipidemia   . Hypertension   . Irritable bowel syndrome with diarrhea   . Liver dysfunction   . Low back pain at multiple sites   . Major depressive disorder   . Osteoarthritis   . Postconcussion syndrome     Past Surgical History:  Procedure Laterality Date  . BREAST BIOPSY Right 08/30/2000  . BREAST LUMPECTOMY Right 2002  . EXCISION / BIOPSY BREAST / NIPPLE / DUCT Right 09/13/2000    Current Outpatient Medications  Medication Sig Dispense Refill  . amLODipine (NORVASC) 2.5 MG tablet Take 2.5 mg by mouth daily.    Marland Kitchen aspirin 81 MG tablet Take 81 mg by mouth daily.    Marland Kitchen atorvastatin (LIPITOR) 40 MG tablet Take 40 mg by mouth at bedtime.    . benazepril (LOTENSIN) 20 MG tablet Take 20 mg by mouth daily.    . DULoxetine (CYMBALTA) 60 MG capsule Take 60 mg by mouth 2 (two) times daily.     . fish oil-omega-3 fatty acids 1000 MG capsule Take 1 g by mouth 2 (two) times daily.    . fluticasone (FLONASE) 50 MCG/ACT nasal spray Place 1 spray into both nostrils daily.    Marland Kitchen glipiZIDE (GLUCOTROL XL) 10 MG 24 hr tablet Take 10 mg by mouth daily.    Marland Kitchen loperamide (IMODIUM) 2 MG capsule Take by mouth 3 (three) times daily as needed for diarrhea or loose stools.    Marland Kitchen loratadine (CLARITIN) 10 MG tablet Take 10 mg by mouth daily.    Marland Kitchen losartan (COZAAR) 100 MG tablet Take 100 mg by mouth daily.    . metFORMIN (GLUCOPHAGE-XR) 500 MG 24 hr tablet Take 1,000 mg by mouth 2 (two) times daily with a meal.    . Multiple Vitamin (MULTIVITAMIN) capsule Take 1 capsule by mouth daily.    Marland Kitchen omeprazole (PRILOSEC) 20 MG capsule Take 20  mg by mouth daily.    . pravastatin (PRAVACHOL) 40 MG tablet Take 40 mg by mouth daily.    . silver sulfADIAZINE (SILVADENE) 1 % cream Apply 1 application topically 2 (two) times daily as needed.     No current facility-administered medications for this visit.     Allergies as of 11/22/2017 - Review Complete 11/22/2017  Allergen Reaction Noted  . Iodinated diagnostic agents Anaphylaxis 10/09/2011  . Penicillins Rash 10/09/2011  . Calcium citrate Diarrhea, Nausea And Vomiting, and Other (See Comments) 07/21/2017  . Hydrochlorothiazide Other (See Comments) 01/06/2017  . Hydrocodone-acetaminophen Itching 10/09/2011    Vitals: BP (!) 144/84   Pulse (!) 111   Resp 16   Ht 5\' 3"  (1.6 m)   Wt 149 lb (67.6 kg)   BMI 26.39 kg/m  Last Weight:  Wt Readings from Last 1 Encounters:  11/22/17 149 lb (67.6 kg)   Last Height:   Ht Readings from Last 1 Encounters:  11/22/17 5\' 3"  (1.6 m)    Physical exam: Exam: Gen: NAD, conversant, well nourised, obese, well groomed                     CV: RRR, no MRG. No Carotid Bruits. No peripheral edema, warm, nontender Eyes: Conjunctivae clear without exudates or hemorrhage  Neuro: Detailed Neurologic Exam  Speech:    Speech is normal; fluent and spontaneous with normal comprehension.  Cognition:  Montreal Cognitive Assessment  11/22/2017  Visuospatial/ Executive (0/5) 3  Naming (0/3) 3  Attention: Read list of digits (0/2) 2  Attention: Read list of letters (0/1) 1  Attention: Serial 7 subtraction starting at 100 (0/3) 2  Language: Repeat phrase (0/2) 2  Language : Fluency (0/1) 1  Abstraction (0/2) 2  Delayed Recall (0/5) 2  Orientation (0/6) 6  Total 24      The patient is oriented to person, place, and time;      Cranial Nerves:    The pupils are equal, round, and reactive to light. The fundi are normal and spontaneous venous pulsations are present. Visual fields are full to finger confrontation. Extraocular movements are intact.  Trigeminal sensation is intact and the muscles of mastication are normal. The face is symmetric. The palate elevates in the midline. Hearing intact. Voice is normal. Shoulder shrug is normal. The tongue has normal motion without fasciculations.   Coordination:    Normal finger to nose and heel to shin. Normal rapid alternating movements.   Gait:    Heel-toe and tandem gait are normal.   Motor Observation:    No asymmetry, no atrophy, and no involuntary movements noted. Tone:    Normal muscle tone.    Posture:    Posture is normal. normal erect    Strength:    Strength is V/V in the upper and lower limbs.      Sensation: intact to LT     Reflex Exam:  DTR's:    Deep tendon reflexes in the upper and lower extremities are symmetrical bilaterally.   Toes:    The toes are downgoing bilaterally.   Clonus:    Clonus is absent.      Assessment/Plan:   72 y.o. female here as a referral from Dr. Scotty Court for aphasia. PMHx breast cancer,, depression, HTN, HLD, DM.  Patient reports aphasia and memory loss which started after a significant fall approximately a year ago where she had head trauma and significant altered mental status and was admitted to [facility] diagnosed with postconcussive syndrome and UTI.  She improved but he continues to have aphasia as well as short-term memory loss.  Symptoms likely from head trauma however cannot rule out dementia as or other neurodegenerative disorder.   Lab tests and MRI brain due to  aphasia, memory loss to evaluate for reversible causes of dementia, stroke, head trauma or other etiology  Patient has symptoms that may be seen with early Alzheimers or earlyFTD (primary progressive aphasia) need FDG PET scan to differentiate. MOCA is 24/30.   Formal Neurocognitive testing   Declines speech therapy, recommend it in the future  Orders Placed This Encounter  Procedures  . MR BRAIN WO CONTRAST  . NM PET Metabolic Brain  . B12 and Folate Panel   . Methylmalonic acid, serum  . TSH  . Homocysteine  . Ambulatory referral to Neuropsychology     Sarina Ill, MD  [facility] [address] [place], [place] [address]  Phone [phone] Fax [phone]

## 2017-11-23 ENCOUNTER — Telehealth: Payer: Self-pay | Admitting: Neurology

## 2017-11-23 ENCOUNTER — Encounter: Payer: Self-pay | Admitting: Psychology

## 2017-11-23 NOTE — Telephone Encounter (Signed)
[facility] Medicare order sent to [facility]. They will reach out to the pt to schedule and no auth.

## 2017-11-23 NOTE — Telephone Encounter (Signed)
Patient Scan is pending for approval . Faxed Clinical notes [phone].

## 2017-11-26 LAB — B12 AND FOLATE PANEL
Folate: 16.2 ng/mL (ref >3.0)
Vitamin B-12: 325 pg/mL (ref 232–1245)

## 2017-11-26 LAB — TSH: TSH: 4.56 u[IU]/mL — ABNORMAL HIGH (ref 0.450–4.500)

## 2017-11-26 LAB — METHYLMALONIC ACID, SERUM: Methylmalonic Acid: 150 nmol/L (ref 0–378)

## 2017-11-26 LAB — HOMOCYSTEINE: HOMOCYSTEINE: 7.8 umol/L (ref 0.0–15.0)

## 2017-11-29 ENCOUNTER — Encounter: Payer: Self-pay | Admitting: Neurology

## 2017-11-29 NOTE — Telephone Encounter (Signed)
Patient scheduled For PET scan Friday 12/03/2017 [id number] 11/26/2017-01/10/2018

## 2017-11-30 ENCOUNTER — Other Ambulatory Visit: Payer: Self-pay | Admitting: Neurology

## 2017-11-30 ENCOUNTER — Encounter: Payer: Self-pay | Admitting: Neurology

## 2017-11-30 ENCOUNTER — Telehealth: Payer: Self-pay | Admitting: Neurology

## 2017-11-30 MED ORDER — ALPRAZOLAM 0.25 MG PO TABS: ORAL_TABLET | ORAL | 0 refills | Status: DC

## 2017-11-30 NOTE — Telephone Encounter (Signed)
Called the patient and made her aware of the normal lab work. Pt verbalized understanding. Pt had no questions at this time but was encouraged to call back if questions arise. She does question if she can get medication to help her to take before her upcoming test (MRI and PET) I informed her that Dr Jaynee Eagles was reviewing that and we would let her know. Pt verbalized understanding.

## 2017-11-30 NOTE — Telephone Encounter (Signed)
-----   Message from Melvenia Beam, MD sent at 11/29/2017 12:25 PM EDT ----- Labs unremarkable

## 2017-12-01 ENCOUNTER — Other Ambulatory Visit: Payer: Self-pay | Admitting: Neurology

## 2017-12-03 ENCOUNTER — Ambulatory Visit (HOSPITAL_COMMUNITY)
Admission: RE | Admit: 2017-12-03 | Discharge: 2017-12-03 | Disposition: A | Discharge status: Home / Self Care | Payer: Medicare Other | Source: Ambulatory Visit | Attending: Neurology | Admitting: Neurology

## 2017-12-03 ENCOUNTER — Ambulatory Visit (HOSPITAL_COMMUNITY): Payer: Medicare Other

## 2017-12-03 DIAGNOSIS — Z79899 Other long term (current) drug therapy: Secondary | ICD-10-CM | POA: Diagnosis not present

## 2017-12-03 DIAGNOSIS — R413 Other amnesia: Secondary | ICD-10-CM | POA: Diagnosis not present

## 2017-12-03 DIAGNOSIS — F0390 Unspecified dementia without behavioral disturbance: Secondary | ICD-10-CM

## 2017-12-03 DIAGNOSIS — R4701 Aphasia: Secondary | ICD-10-CM | POA: Diagnosis not present

## 2017-12-03 DIAGNOSIS — R4189 Other symptoms and signs involving cognitive functions and awareness: Secondary | ICD-10-CM

## 2017-12-03 LAB — GLUCOSE, CAPILLARY: Glucose-Capillary: 180 mg/dL — ABNORMAL HIGH (ref 70–99)

## 2017-12-03 MED ORDER — FLUDEOXYGLUCOSE F - 18 (FDG) INJECTION
9.9000 | Freq: Once | INTRAVENOUS | Status: AC | PRN
  Administered 2017-12-03: 9.9 via INTRAVENOUS

## 2017-12-09 ENCOUNTER — Encounter: Payer: Self-pay | Admitting: *Deleted

## 2017-12-10 ENCOUNTER — Ambulatory Visit
Admission: RE | Admit: 2017-12-10 | Discharge: 2017-12-10 | Disposition: A | Discharge status: Home / Self Care | Payer: Medicare Other | Source: Ambulatory Visit | Attending: Neurology | Admitting: Neurology

## 2017-12-10 DIAGNOSIS — R4701 Aphasia: Secondary | ICD-10-CM | POA: Diagnosis not present

## 2017-12-10 DIAGNOSIS — R4189 Other symptoms and signs involving cognitive functions and awareness: Secondary | ICD-10-CM

## 2017-12-10 DIAGNOSIS — R413 Other amnesia: Secondary | ICD-10-CM

## 2017-12-13 ENCOUNTER — Telehealth: Payer: Self-pay | Admitting: Neurology

## 2017-12-13 NOTE — Telephone Encounter (Signed)
Discussed MRi of the brain which was normal for age, and FDG PET which showed no evidence for Alzheimers or FTD. Recommend proceeding with formal testing and she is to call me if she progresses.

## 2018-01-05 ENCOUNTER — Encounter (HOSPITAL_COMMUNITY): Payer: Self-pay

## 2018-01-05 ENCOUNTER — Emergency Department (HOSPITAL_COMMUNITY): Payer: Medicare Other

## 2018-01-05 ENCOUNTER — Other Ambulatory Visit: Payer: Self-pay

## 2018-01-05 ENCOUNTER — Emergency Department (HOSPITAL_COMMUNITY)
Admission: EM | Admit: 2018-01-05 | Discharge: 2018-01-05 | Disposition: A | Discharge status: Home / Self Care | Payer: Medicare Other | Attending: Emergency Medicine | Admitting: Emergency Medicine

## 2018-01-05 DIAGNOSIS — I10 Essential (primary) hypertension: Secondary | ICD-10-CM | POA: Diagnosis not present

## 2018-01-05 DIAGNOSIS — Z79899 Other long term (current) drug therapy: Secondary | ICD-10-CM | POA: Diagnosis not present

## 2018-01-05 DIAGNOSIS — Z853 Personal history of malignant neoplasm of breast: Secondary | ICD-10-CM | POA: Insufficient documentation

## 2018-01-05 DIAGNOSIS — Z7984 Long term (current) use of oral hypoglycemic drugs: Secondary | ICD-10-CM | POA: Diagnosis not present

## 2018-01-05 DIAGNOSIS — Z7982 Long term (current) use of aspirin: Secondary | ICD-10-CM | POA: Diagnosis not present

## 2018-01-05 DIAGNOSIS — R55 Syncope and collapse: Secondary | ICD-10-CM | POA: Diagnosis not present

## 2018-01-05 DIAGNOSIS — E119 Type 2 diabetes mellitus without complications: Secondary | ICD-10-CM | POA: Diagnosis not present

## 2018-01-05 LAB — BASIC METABOLIC PANEL
Anion gap: 11 (ref 5–15)
BUN: 19 mg/dL (ref 8–23)
CHLORIDE: 105 mmol/L (ref 98–111)
CO2: 23 mmol/L (ref 22–32)
CREATININE: 1.01 mg/dL — AB (ref 0.44–1.00)
Calcium: 9.8 mg/dL (ref 8.9–10.3)
GFR calc Af Amer: >60 mL/min (ref >60)
GFR calc non Af Amer: 54 mL/min — ABNORMAL LOW (ref >60)
GLUCOSE: 211 mg/dL — AB (ref 70–99)
Potassium: 3.5 mmol/L (ref 3.5–5.1)
Sodium: 139 mmol/L (ref 135–145)

## 2018-01-05 LAB — CBC WITH DIFFERENTIAL/PLATELET
Basophils Absolute: 0.1 10*3/uL (ref 0.0–0.1)
Basophils Relative: 1 %
EOS ABS: 0.5 10*3/uL (ref 0.0–0.7)
EOS PCT: 4 %
HCT: 41.3 % (ref 36.0–46.0)
Hemoglobin: 13.5 g/dL (ref 12.0–15.0)
LYMPHS ABS: 3.2 10*3/uL (ref 0.7–4.0)
Lymphocytes Relative: 26 %
MCH: 26.7 pg (ref 26.0–34.0)
MCHC: 32.7 g/dL (ref 30.0–36.0)
MCV: 81.8 fL (ref 78.0–100.0)
MONO ABS: 0.7 10*3/uL (ref 0.1–1.0)
Monocytes Relative: 6 %
Neutro Abs: 7.8 10*3/uL — ABNORMAL HIGH (ref 1.7–7.7)
Neutrophils Relative %: 63 %
PLATELETS: 254 10*3/uL (ref 150–400)
RBC: 5.05 MIL/uL (ref 3.87–5.11)
RDW: 14.9 % (ref 11.5–15.5)
WBC: 12.2 10*3/uL — AB (ref 4.0–10.5)

## 2018-01-05 LAB — CBG MONITORING, ED: Glucose-Capillary: 216 mg/dL — ABNORMAL HIGH (ref 70–99)

## 2018-01-05 LAB — TROPONIN I

## 2018-01-05 MED ORDER — SODIUM CHLORIDE 0.9 % IV SOLN: INTRAVENOUS | Status: DC

## 2018-01-05 MED ORDER — SODIUM CHLORIDE 0.9 % IV BOLUS
1000.0000 mL | Freq: Once | INTRAVENOUS | Status: AC
  Administered 2018-01-05: 1000 mL via INTRAVENOUS

## 2018-01-05 NOTE — ED Triage Notes (Signed)
Pt was at a restaurant and had a syncopal episode.  A nurse was at the table behind her and offered her assistance.  Reports pt is diabetic but did not have her meter.  Reports pt was unresposive and diaphoretic for approx 1 1/2 min.  Nurse says pt's jaws were clenched while she was unresposive so she put some orange juice on her lips.  Pt became resposive but still diaphoretic, weak, and nauseated.  Pt vomiting at this time.  Pt alert and oriented.  Says feels very unsteady.  CBG 216 in ED.

## 2018-01-05 NOTE — ED Provider Notes (Signed)
[facility] EMERGENCY DEPARTMENT Provider Note   CSN: [id number] Arrival date & time: 01/05/18  1828     History   Chief Complaint Chief Complaint  Patient presents with  . Loss of Consciousness    HPI Diana Hall is a 72 y.o. female.  HPI Presents to the emergency room for evaluation of a syncopal episode.  Patient states the last day or 2 she is had some generalized malaise and has not felt great but has not had any specific complaints.  She attributed to being somewhat stressed with all of family members and grandchildren visiting.  Patient was out to dinner this evening while she started to feel very hot and lightheaded.  Patient felt like she needed to go outside and get some air.  She then had a syncopal episode.  Family member states she was out for a minute or 2.  There was a nurse next to them who administered some orange juice and glucose.  Patient started to respond somewhat afterwards but she was still diaphoretic weak and nauseated.  Patient vomited once.  Patient now states she is feeling better.  She denies any headache or chest pain no focal numbness or weakness.  No abdominal pain or diarrhea.  Past Medical History:  Diagnosis Date  . Allergic rhinitis   . Benign essential hypertension   . Breast cancer, stage 1 ([place])   . Cancer [facility]) 2002   breast cancer  . Diabetes mellitus type 2 in obese ([place])   . Dyslipidemia   . Hypertension   . Irritable bowel syndrome with diarrhea   . Liver dysfunction   . Low back pain at multiple sites   . Major depressive disorder   . Osteoarthritis   . Postconcussion syndrome     Patient Active Problem List   Diagnosis Date Noted  . Diabetes mellitus type 2 in obese ([place]) 11/06/2016  . Essential hypertension 11/06/2016  . Delirium 11/05/2016    Past Surgical History:  Procedure Laterality Date  . ABDOMINAL HYSTERECTOMY    . BREAST BIOPSY Right 08/30/2000  . BREAST LUMPECTOMY Right 2002  . CHOLECYSTECTOMY    .  EXCISION / BIOPSY BREAST / NIPPLE / DUCT Right 09/13/2000     OB History   None      Home Medications    Prior to Admission medications   Medication Sig Start Date End Date Taking? Authorizing Provider  ALPRAZolam (XANAX) 0.25 MG tablet Take 1-2 tabs (0.25mg -0.50mg ) 30-60 minutes before procedure. May repeat if needed.Do not drive. 11/30/17   Melvenia Beam, MD  amLODipine (NORVASC) 2.5 MG tablet Take 2.5 mg by mouth daily.    [provider]  aspirin 81 MG tablet Take 81 mg by mouth daily.    [provider]  atorvastatin (LIPITOR) 40 MG tablet Take 40 mg by mouth at bedtime.    [provider]  benazepril (LOTENSIN) 20 MG tablet Take 20 mg by mouth daily.    [provider]  DULoxetine (CYMBALTA) 60 MG capsule Take 60 mg by mouth 2 (two) times daily.     [provider]  fish oil-omega-3 fatty acids 1000 MG capsule Take 1 g by mouth 2 (two) times daily.    [provider]  fluticasone (FLONASE) 50 MCG/ACT nasal spray Place 1 spray into both nostrils daily.    [provider]  glipiZIDE (GLUCOTROL XL) 10 MG 24 hr tablet Take 10 mg by mouth daily.    [provider]  loperamide (IMODIUM) 2 MG capsule Take by mouth 3 (three) times daily as needed for diarrhea or loose stools.    [provider]  loratadine (CLARITIN) 10 MG tablet Take 10 mg by mouth daily.    [provider]  losartan (COZAAR) 100 MG tablet Take 100 mg by mouth daily.    [provider]  metFORMIN (GLUCOPHAGE-XR) 500 MG 24 hr tablet Take 1,000 mg by mouth 2 (two) times daily with a meal.    [provider]  Multiple Vitamin (MULTIVITAMIN) capsule Take 1 capsule by mouth daily.    [provider]  omeprazole (PRILOSEC) 20 MG capsule Take 20 mg by mouth daily.    [provider]  pravastatin (PRAVACHOL) 40 MG tablet Take 40 mg by mouth daily.    [provider]  silver sulfADIAZINE  (SILVADENE) 1 % cream Apply 1 application topically 2 (two) times daily as needed.    [provider]    Family History Family History  Problem Relation Age of Onset  . Breast cancer Mother 13  . Alzheimer's disease Father     Social History Social History   Tobacco Use  . Smoking status: Never Smoker  . Smokeless tobacco: Never Used  Substance Use Topics  . Alcohol use: Yes    Comment: rare  . Drug use: No     Allergies   Iodinated diagnostic agents; Penicillins; Calcium citrate; Hydrochlorothiazide; and Hydrocodone-acetaminophen   Review of Systems Review of Systems  All other systems reviewed and are negative.    Physical Exam Updated Vital Signs BP 139/77   Pulse 84   Temp 97.9 F (36.6 C) (Oral)   Resp 20   Ht 1.626 m (5\' 4" )   Wt 65.8 kg   SpO2 94%   BMI 24.89 kg/m   Physical Exam  Constitutional: She is oriented to person, place, and time. She appears well-developed and well-nourished. No distress.  HENT:  Head: Normocephalic and atraumatic.  Right Ear: External ear normal.  Left Ear: External ear normal.  Mouth/Throat: Oropharynx is clear and moist.  Eyes: Conjunctivae are normal. Right eye exhibits no discharge. Left eye exhibits no discharge. No scleral icterus.  Neck: Neck supple. No tracheal deviation present.  Cardiovascular: Normal rate, regular rhythm and intact distal pulses.  Pulmonary/Chest: Effort normal and breath sounds normal. No stridor. No respiratory distress. She has no wheezes. She has no rales.  Abdominal: Soft. Bowel sounds are normal. She exhibits no distension. There is no tenderness. There is no rebound and no guarding.  Musculoskeletal: She exhibits no edema or tenderness.  Neurological: She is alert and oriented to person, place, and time. She has normal strength. No cranial nerve deficit (no facial droop, extraocular movements intact, no slurred speech) or sensory deficit. She exhibits normal muscle tone. She  displays no seizure activity. Coordination normal.  No pronator drift bilateral upper extrem, able to hold both legs off bed for 5 seconds, sensation intact in all extremities, no visual field cuts, no left or right sided neglect, normal finger-nose exam bilaterally, no nystagmus noted   Skin: Skin is warm and dry. No rash noted.  Psychiatric: She has a normal mood and affect.  Nursing note and vitals reviewed.    ED Treatments / Results  Labs (all labs ordered are listed, but only abnormal results are displayed) Labs Reviewed  BASIC METABOLIC PANEL - Abnormal; Notable for the following components:      Result Value   Glucose, Bld 211 (*)  Creatinine, Ser 1.01 (*)    GFR calc non Af Amer 54 (*)    All other components within normal limits  CBC WITH DIFFERENTIAL/PLATELET - Abnormal; Notable for the following components:   WBC 12.2 (*)    Neutro Abs 7.8 (*)    All other components within normal limits  CBG MONITORING, ED - Abnormal; Notable for the following components:   Glucose-Capillary 216 (*)    All other components within normal limits  TROPONIN I    EKG EKG Interpretation  Date/Time:  Wednesday January 05 2018 18:48:47 EDT Ventricular Rate:  93 PR Interval:    QRS Duration: 93 QT Interval:  354 QTC Calculation: 441 R Axis:   -22 Text Interpretation:  Sinus rhythm Abnormal R-wave progression, early transition Inferior infarct, old No significant change since last tracing Confirmed by Dorie Rank [phone]) on 01/05/2018 6:58:06 PM   Radiology Dg Chest 2 View  Result Date: 01/05/2018 CLINICAL DATA:  Syncopal episode EXAM: CHEST - 2 VIEW COMPARISON:  11/05/2016 FINDINGS: Cardiac shadow is stable. Aortic calcifications are again seen. Hiatal hernia is again noted. The lungs are well aerated without focal infiltrate or sizable effusion. Chronic compression deformity in the lower thoracic spine is noted. IMPRESSION: No active cardiopulmonary disease. Electronically Signed    By: Inez Catalina M.D.   On: 01/05/2018 20:04    Procedures Procedures (including critical care time)  Medications Ordered in ED Medications  sodium chloride 0.9 % bolus 1,000 mL (0 mLs Intravenous Stopped 01/05/18 2101)    And  0.9 %  sodium chloride infusion (has no administration in time range)     Initial Impression / Assessment and Plan / ED Course  I have reviewed the triage vital signs and the nursing notes.  Pertinent labs & imaging results that were available during my care of the patient were reviewed by me and considered in my medical decision making (see chart for details).  Clinical Course as of Jan 05 2130  Wed Jan 05, 2018  2130 lAbs reviewed.  Patient's has a mildly elevated blood sugar   [JK]    Clinical Course User Index [JK] Dorie Rank, MD    Patient presented to the emergency room for evaluation after a syncopal episode.  Family suspects it might of been a hypoglycemic episode.  She was apparently diaphoretic and nauseated.  Her symptoms improved after she was given oral sugar.  Patient denies skipping any meals.  She is only on oral hypoglycemic agents.  I am not certain she had a hypoglycemic episode as she is only had elevated blood sugars when measured, granted this was after being given oral glucose.  Also possible she may have had a vasovagal episode.  Patient is feeling well she is hungry and wants to go home.  I have a low suspicion for any serious cardiac dysrhythmia.  Recommend she monitor blood sugar closely and have her follow-up with her doctor next week  Final Clinical Impressions(s) / ED Diagnoses   Final diagnoses:  Syncope, unspecified syncope type    ED Discharge Orders    None       Dorie Rank, MD 01/05/18 2131

## 2018-01-05 NOTE — Discharge Instructions (Signed)
Monitor your blood sugar closely, do not take your evening dose of diabetes medications, follow up with your doctor next week to be rechecked

## 2018-02-23 ENCOUNTER — Ambulatory Visit: Payer: Medicare Other | Admitting: "Endocrinology

## 2018-03-28 ENCOUNTER — Ambulatory Visit: Payer: Medicare Other | Admitting: Neurology

## 2018-05-05 ENCOUNTER — Encounter: Payer: Medicare Other | Admitting: Psychology

## 2018-06-16 ENCOUNTER — Encounter: Payer: Medicare Other | Admitting: Psychology

## 2018-09-08 ENCOUNTER — Other Ambulatory Visit: Payer: Self-pay | Admitting: Family Medicine

## 2018-09-08 DIAGNOSIS — Z1231 Encounter for screening mammogram for malignant neoplasm of breast: Secondary | ICD-10-CM

## 2018-09-12 ENCOUNTER — Other Ambulatory Visit: Payer: Self-pay | Admitting: Family Medicine

## 2018-09-12 DIAGNOSIS — N6310 Unspecified lump in the right breast, unspecified quadrant: Secondary | ICD-10-CM

## 2018-09-16 ENCOUNTER — Ambulatory Visit
Admission: RE | Admit: 2018-09-16 | Discharge: 2018-09-16 | Disposition: A | Discharge status: Home / Self Care | Payer: Medicare Other | Source: Ambulatory Visit | Attending: Family Medicine | Admitting: Family Medicine

## 2018-09-16 ENCOUNTER — Other Ambulatory Visit: Payer: Self-pay

## 2018-09-16 ENCOUNTER — Other Ambulatory Visit: Payer: Self-pay | Admitting: Family Medicine

## 2018-09-16 DIAGNOSIS — N6310 Unspecified lump in the right breast, unspecified quadrant: Secondary | ICD-10-CM

## 2018-10-26 ENCOUNTER — Other Ambulatory Visit: Payer: Self-pay | Admitting: Family Medicine

## 2018-10-26 DIAGNOSIS — Z1231 Encounter for screening mammogram for malignant neoplasm of breast: Secondary | ICD-10-CM

## 2018-11-10 ENCOUNTER — Ambulatory Visit: Payer: Medicare Other

## 2018-11-14 IMAGING — DX DG CHEST 2V
2 series · 2 of 2 positions shown · non-contrast
Comparison: 11/05/2016

CLINICAL DATA: Syncopal episode

EXAM:
CHEST - 2 VIEW

[chest pa]
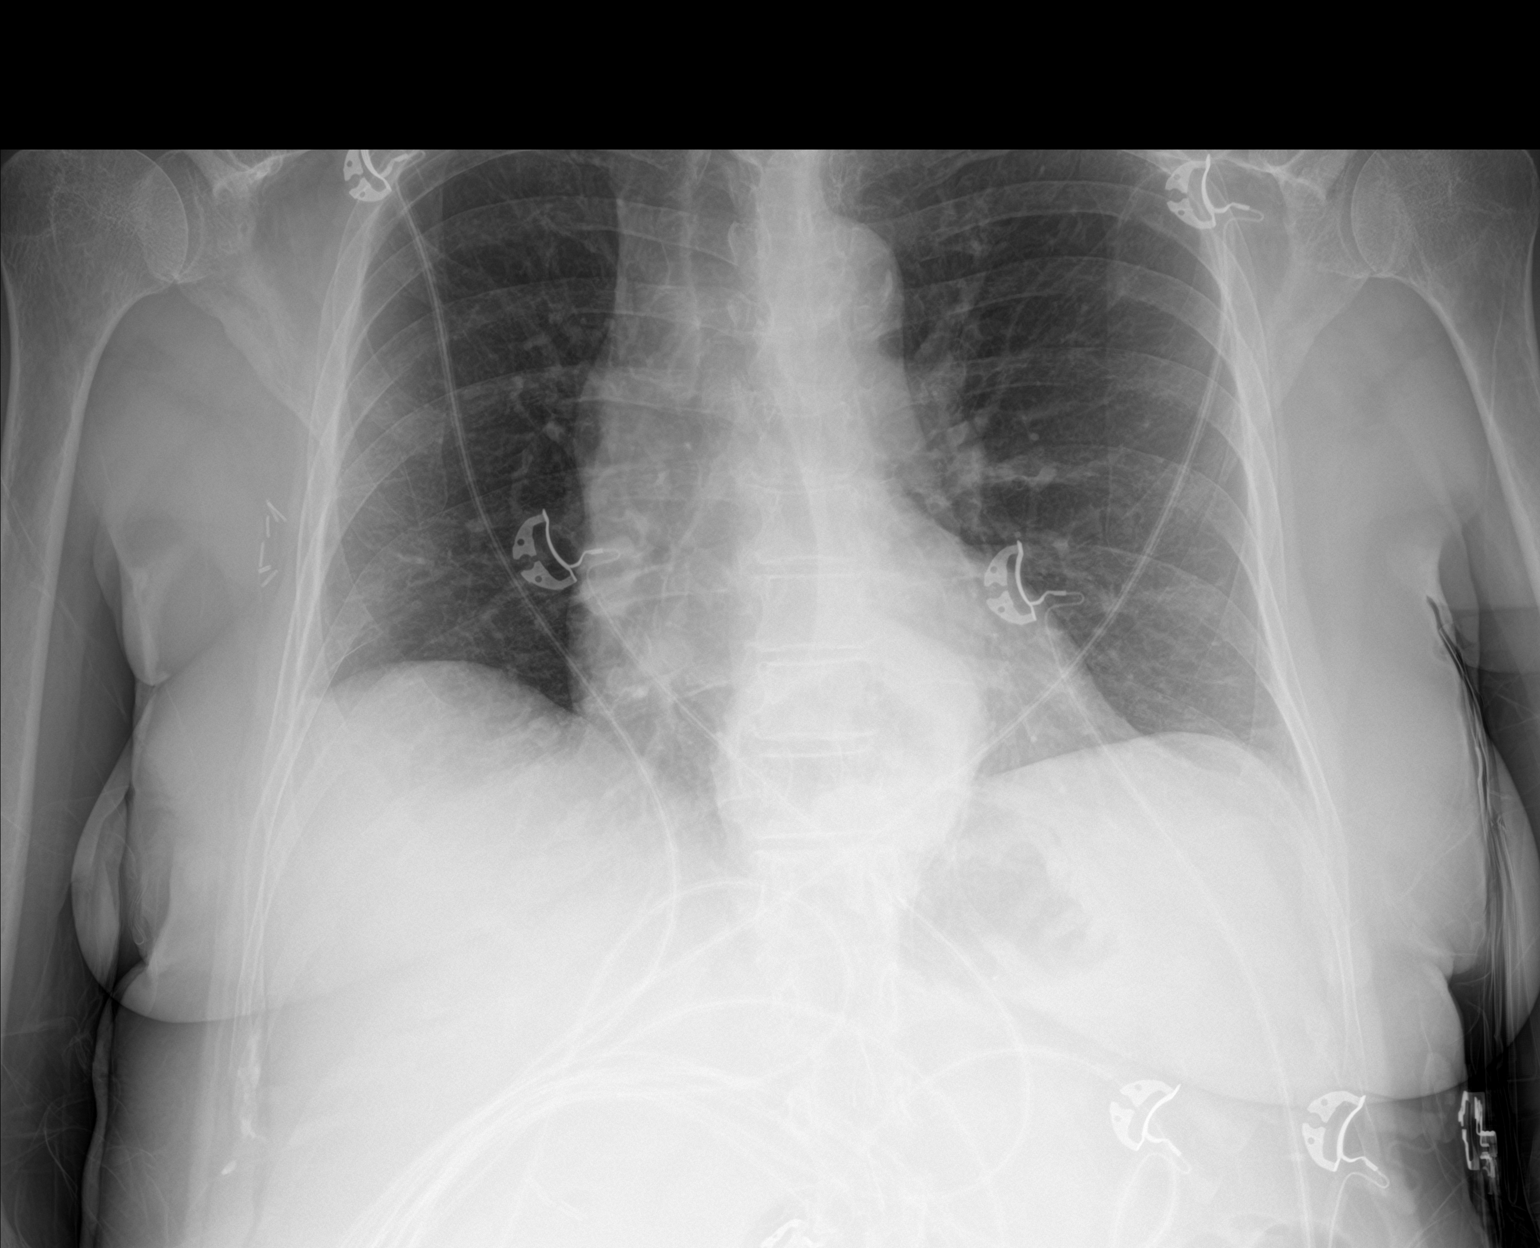

[chest lat]
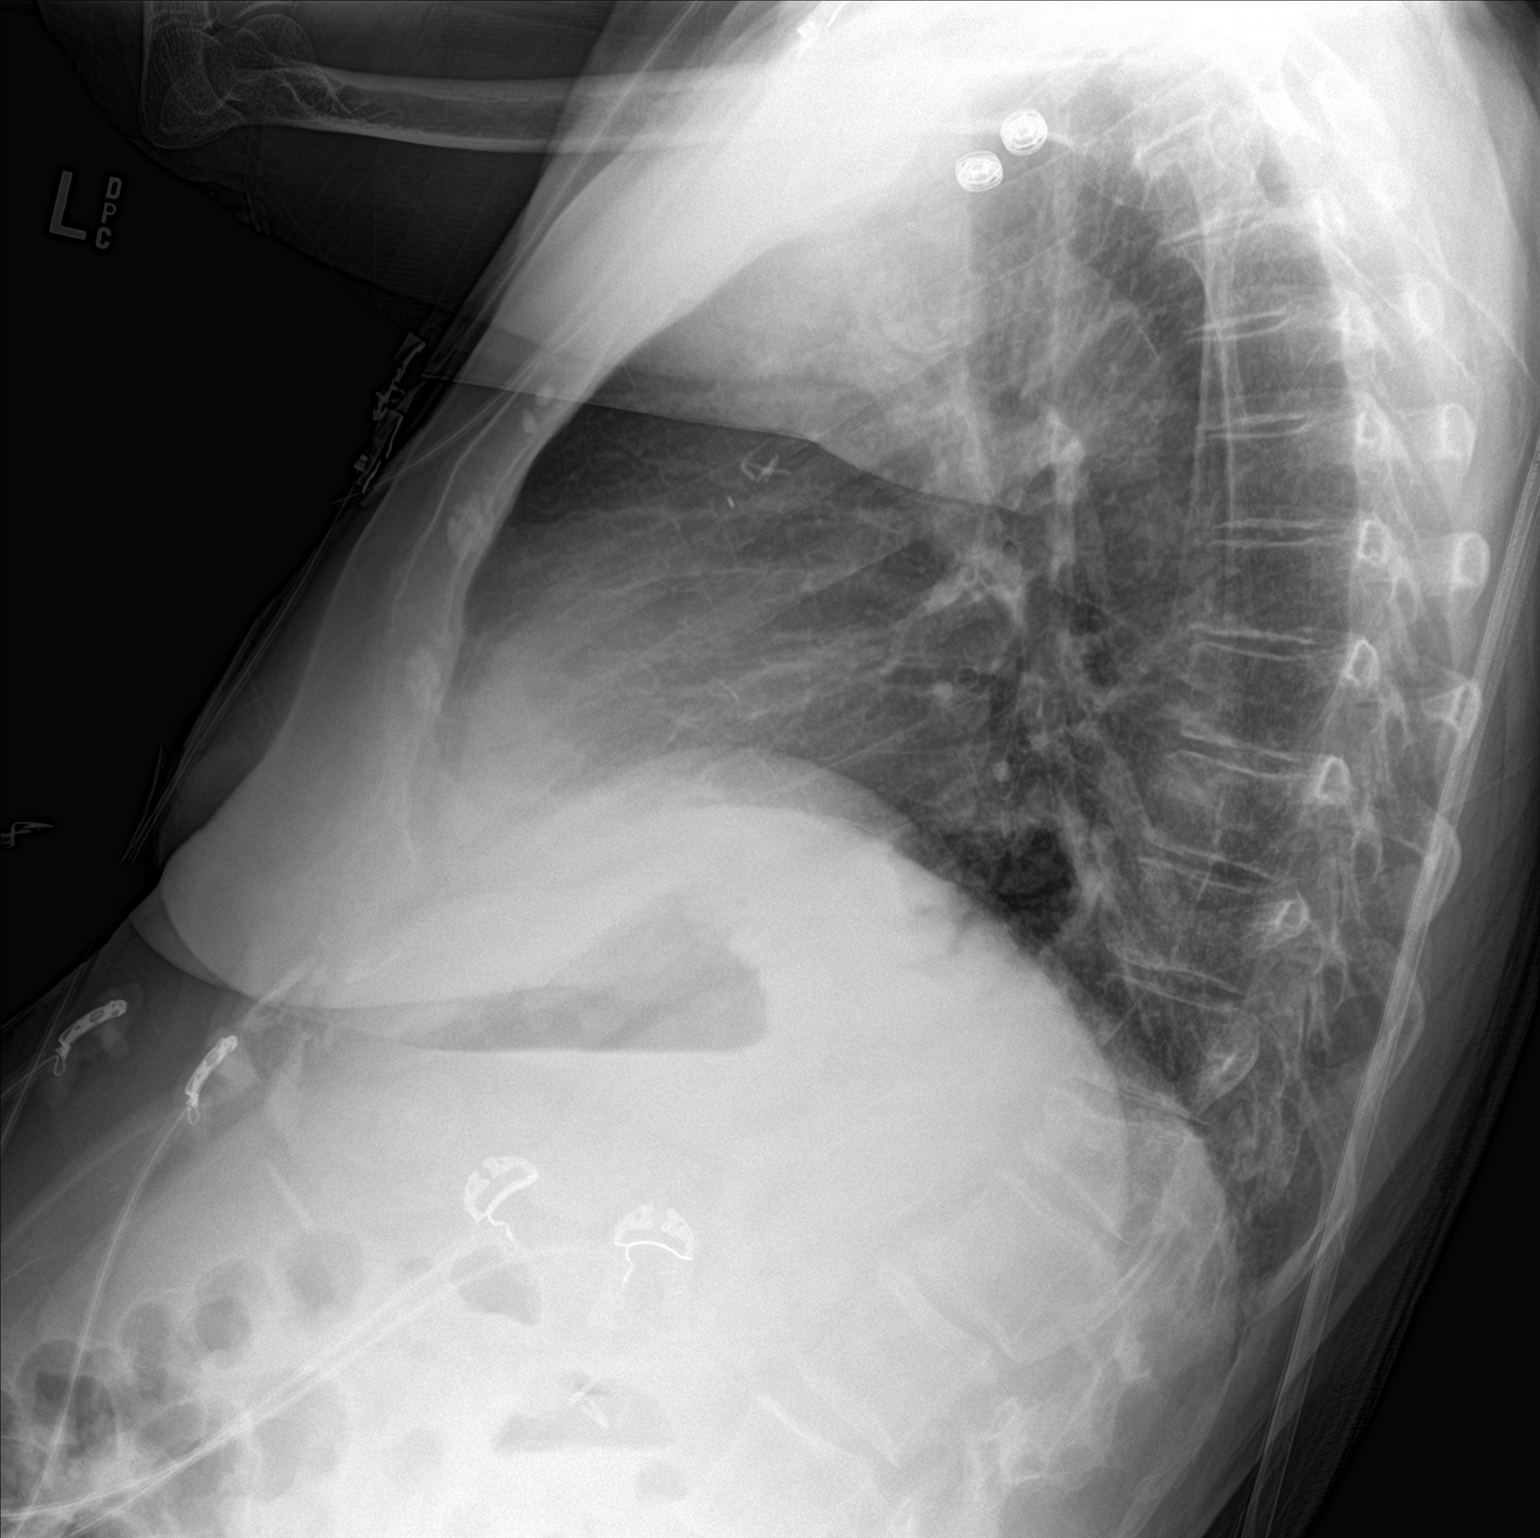

[2 of 2 positions shown; findings below may reference images not displayed]

FINDINGS: Cardiac shadow is stable. Aortic calcifications are again seen.
Hiatal hernia is again noted. The lungs are well aerated without
focal infiltrate or sizable effusion. Chronic compression deformity
in the lower thoracic spine is noted.
IMPRESSION: No active cardiopulmonary disease.

## 2018-12-13 ENCOUNTER — Ambulatory Visit: Payer: Medicare Other

## 2018-12-23 ENCOUNTER — Ambulatory Visit
Admission: RE | Admit: 2018-12-23 | Discharge: 2018-12-23 | Disposition: A | Discharge status: Home / Self Care | Payer: Medicare Other | Source: Ambulatory Visit | Attending: Family Medicine | Admitting: Family Medicine

## 2018-12-23 ENCOUNTER — Other Ambulatory Visit: Payer: Self-pay | Admitting: Family Medicine

## 2018-12-23 ENCOUNTER — Other Ambulatory Visit: Payer: Self-pay

## 2018-12-23 ENCOUNTER — Other Ambulatory Visit: Payer: Self-pay | Admitting: Internal Medicine

## 2018-12-23 DIAGNOSIS — Z1231 Encounter for screening mammogram for malignant neoplasm of breast: Secondary | ICD-10-CM

## 2019-08-02 DIAGNOSIS — E1121 Type 2 diabetes mellitus with diabetic nephropathy: Secondary | ICD-10-CM | POA: Diagnosis not present

## 2019-08-02 DIAGNOSIS — I10 Essential (primary) hypertension: Secondary | ICD-10-CM | POA: Diagnosis not present

## 2019-08-02 DIAGNOSIS — R194 Change in bowel habit: Secondary | ICD-10-CM | POA: Diagnosis not present

## 2019-08-07 DIAGNOSIS — E1121 Type 2 diabetes mellitus with diabetic nephropathy: Secondary | ICD-10-CM | POA: Diagnosis not present

## 2019-08-07 DIAGNOSIS — E785 Hyperlipidemia, unspecified: Secondary | ICD-10-CM | POA: Diagnosis not present

## 2019-08-07 DIAGNOSIS — N182 Chronic kidney disease, stage 2 (mild): Secondary | ICD-10-CM | POA: Diagnosis not present

## 2019-08-07 DIAGNOSIS — I10 Essential (primary) hypertension: Secondary | ICD-10-CM | POA: Diagnosis not present

## 2019-08-10 DIAGNOSIS — M25512 Pain in left shoulder: Secondary | ICD-10-CM | POA: Diagnosis not present

## 2019-08-10 DIAGNOSIS — Z0001 Encounter for general adult medical examination with abnormal findings: Secondary | ICD-10-CM | POA: Diagnosis not present

## 2019-08-10 DIAGNOSIS — F419 Anxiety disorder, unspecified: Secondary | ICD-10-CM | POA: Diagnosis not present

## 2019-08-10 DIAGNOSIS — J302 Other seasonal allergic rhinitis: Secondary | ICD-10-CM | POA: Diagnosis not present

## 2019-08-10 DIAGNOSIS — E785 Hyperlipidemia, unspecified: Secondary | ICD-10-CM | POA: Diagnosis not present

## 2019-08-10 DIAGNOSIS — E1121 Type 2 diabetes mellitus with diabetic nephropathy: Secondary | ICD-10-CM | POA: Diagnosis not present

## 2019-08-10 DIAGNOSIS — F331 Major depressive disorder, recurrent, moderate: Secondary | ICD-10-CM | POA: Diagnosis not present

## 2019-08-10 DIAGNOSIS — I10 Essential (primary) hypertension: Secondary | ICD-10-CM | POA: Diagnosis not present

## 2019-08-10 DIAGNOSIS — R194 Change in bowel habit: Secondary | ICD-10-CM | POA: Diagnosis not present

## 2019-08-10 DIAGNOSIS — K219 Gastro-esophageal reflux disease without esophagitis: Secondary | ICD-10-CM | POA: Diagnosis not present

## 2019-10-03 DIAGNOSIS — W57XXXA Bitten or stung by nonvenomous insect and other nonvenomous arthropods, initial encounter: Secondary | ICD-10-CM | POA: Diagnosis not present

## 2019-10-03 DIAGNOSIS — S70361A Insect bite (nonvenomous), right thigh, initial encounter: Secondary | ICD-10-CM | POA: Diagnosis not present

## 2019-11-09 DIAGNOSIS — S70361D Insect bite (nonvenomous), right thigh, subsequent encounter: Secondary | ICD-10-CM | POA: Diagnosis not present

## 2019-11-09 DIAGNOSIS — L821 Other seborrheic keratosis: Secondary | ICD-10-CM | POA: Diagnosis not present

## 2019-11-09 DIAGNOSIS — W57XXXD Bitten or stung by nonvenomous insect and other nonvenomous arthropods, subsequent encounter: Secondary | ICD-10-CM | POA: Diagnosis not present

## 2019-11-20 DIAGNOSIS — R194 Change in bowel habit: Secondary | ICD-10-CM | POA: Diagnosis not present

## 2019-11-20 DIAGNOSIS — N182 Chronic kidney disease, stage 2 (mild): Secondary | ICD-10-CM | POA: Diagnosis not present

## 2019-11-20 DIAGNOSIS — E785 Hyperlipidemia, unspecified: Secondary | ICD-10-CM | POA: Diagnosis not present

## 2019-11-20 DIAGNOSIS — E1121 Type 2 diabetes mellitus with diabetic nephropathy: Secondary | ICD-10-CM | POA: Diagnosis not present

## 2019-11-20 DIAGNOSIS — Z0001 Encounter for general adult medical examination with abnormal findings: Secondary | ICD-10-CM | POA: Diagnosis not present

## 2019-11-20 DIAGNOSIS — F331 Major depressive disorder, recurrent, moderate: Secondary | ICD-10-CM | POA: Diagnosis not present

## 2019-11-20 DIAGNOSIS — N189 Chronic kidney disease, unspecified: Secondary | ICD-10-CM | POA: Diagnosis not present

## 2019-11-20 DIAGNOSIS — M25512 Pain in left shoulder: Secondary | ICD-10-CM | POA: Diagnosis not present

## 2019-11-20 DIAGNOSIS — F419 Anxiety disorder, unspecified: Secondary | ICD-10-CM | POA: Diagnosis not present

## 2019-11-23 DIAGNOSIS — L986 Other infiltrative disorders of the skin and subcutaneous tissue: Secondary | ICD-10-CM | POA: Diagnosis not present

## 2019-11-23 DIAGNOSIS — L820 Inflamed seborrheic keratosis: Secondary | ICD-10-CM | POA: Diagnosis not present

## 2019-11-23 DIAGNOSIS — D485 Neoplasm of uncertain behavior of skin: Secondary | ICD-10-CM | POA: Diagnosis not present

## 2019-11-28 DIAGNOSIS — R194 Change in bowel habit: Secondary | ICD-10-CM | POA: Diagnosis not present

## 2019-11-28 DIAGNOSIS — F419 Anxiety disorder, unspecified: Secondary | ICD-10-CM | POA: Diagnosis not present

## 2019-11-28 DIAGNOSIS — N182 Chronic kidney disease, stage 2 (mild): Secondary | ICD-10-CM | POA: Diagnosis not present

## 2019-11-28 DIAGNOSIS — I10 Essential (primary) hypertension: Secondary | ICD-10-CM | POA: Diagnosis not present

## 2019-11-28 DIAGNOSIS — E1121 Type 2 diabetes mellitus with diabetic nephropathy: Secondary | ICD-10-CM | POA: Diagnosis not present

## 2019-11-28 DIAGNOSIS — M25512 Pain in left shoulder: Secondary | ICD-10-CM | POA: Diagnosis not present

## 2019-11-28 DIAGNOSIS — N189 Chronic kidney disease, unspecified: Secondary | ICD-10-CM | POA: Diagnosis not present

## 2019-11-28 DIAGNOSIS — R718 Other abnormality of red blood cells: Secondary | ICD-10-CM | POA: Diagnosis not present

## 2019-11-28 DIAGNOSIS — E785 Hyperlipidemia, unspecified: Secondary | ICD-10-CM | POA: Diagnosis not present

## 2019-11-28 DIAGNOSIS — Z0001 Encounter for general adult medical examination with abnormal findings: Secondary | ICD-10-CM | POA: Diagnosis not present

## 2019-11-28 DIAGNOSIS — J302 Other seasonal allergic rhinitis: Secondary | ICD-10-CM | POA: Diagnosis not present

## 2019-11-28 DIAGNOSIS — K219 Gastro-esophageal reflux disease without esophagitis: Secondary | ICD-10-CM | POA: Diagnosis not present

## 2019-11-28 DIAGNOSIS — Z0189 Encounter for other specified special examinations: Secondary | ICD-10-CM | POA: Diagnosis not present

## 2019-12-21 DIAGNOSIS — G3184 Mild cognitive impairment, so stated: Secondary | ICD-10-CM | POA: Diagnosis not present

## 2019-12-21 DIAGNOSIS — F419 Anxiety disorder, unspecified: Secondary | ICD-10-CM | POA: Diagnosis not present

## 2019-12-21 DIAGNOSIS — J302 Other seasonal allergic rhinitis: Secondary | ICD-10-CM | POA: Diagnosis not present

## 2019-12-21 DIAGNOSIS — R5383 Other fatigue: Secondary | ICD-10-CM | POA: Diagnosis not present

## 2019-12-21 DIAGNOSIS — F331 Major depressive disorder, recurrent, moderate: Secondary | ICD-10-CM | POA: Diagnosis not present

## 2019-12-21 DIAGNOSIS — I10 Essential (primary) hypertension: Secondary | ICD-10-CM | POA: Diagnosis not present

## 2020-01-23 ENCOUNTER — Other Ambulatory Visit: Payer: Self-pay | Admitting: Internal Medicine

## 2020-01-23 DIAGNOSIS — Z1231 Encounter for screening mammogram for malignant neoplasm of breast: Secondary | ICD-10-CM

## 2020-01-30 ENCOUNTER — Ambulatory Visit
Admission: RE | Admit: 2020-01-30 | Discharge: 2020-01-30 | Disposition: A | Discharge status: Home / Self Care | Payer: Medicare PPO | Source: Ambulatory Visit | Attending: Internal Medicine | Admitting: Internal Medicine

## 2020-01-30 ENCOUNTER — Other Ambulatory Visit: Payer: Self-pay

## 2020-01-30 DIAGNOSIS — Z1231 Encounter for screening mammogram for malignant neoplasm of breast: Secondary | ICD-10-CM | POA: Diagnosis not present

## 2020-03-01 DIAGNOSIS — J302 Other seasonal allergic rhinitis: Secondary | ICD-10-CM | POA: Diagnosis not present

## 2020-03-01 DIAGNOSIS — G3184 Mild cognitive impairment, so stated: Secondary | ICD-10-CM | POA: Diagnosis not present

## 2020-03-01 DIAGNOSIS — I10 Essential (primary) hypertension: Secondary | ICD-10-CM | POA: Diagnosis not present

## 2020-03-01 DIAGNOSIS — Z0001 Encounter for general adult medical examination with abnormal findings: Secondary | ICD-10-CM | POA: Diagnosis not present

## 2020-03-01 DIAGNOSIS — Z0189 Encounter for other specified special examinations: Secondary | ICD-10-CM | POA: Diagnosis not present

## 2020-03-01 DIAGNOSIS — N189 Chronic kidney disease, unspecified: Secondary | ICD-10-CM | POA: Diagnosis not present

## 2020-03-01 DIAGNOSIS — E785 Hyperlipidemia, unspecified: Secondary | ICD-10-CM | POA: Diagnosis not present

## 2020-03-01 DIAGNOSIS — N182 Chronic kidney disease, stage 2 (mild): Secondary | ICD-10-CM | POA: Diagnosis not present

## 2020-03-01 DIAGNOSIS — E1121 Type 2 diabetes mellitus with diabetic nephropathy: Secondary | ICD-10-CM | POA: Diagnosis not present

## 2020-03-04 DIAGNOSIS — C50911 Malignant neoplasm of unspecified site of right female breast: Secondary | ICD-10-CM | POA: Diagnosis not present

## 2020-03-05 DIAGNOSIS — E1121 Type 2 diabetes mellitus with diabetic nephropathy: Secondary | ICD-10-CM | POA: Diagnosis not present

## 2020-03-05 DIAGNOSIS — K219 Gastro-esophageal reflux disease without esophagitis: Secondary | ICD-10-CM | POA: Diagnosis not present

## 2020-03-05 DIAGNOSIS — R718 Other abnormality of red blood cells: Secondary | ICD-10-CM | POA: Diagnosis not present

## 2020-03-05 DIAGNOSIS — M25512 Pain in left shoulder: Secondary | ICD-10-CM | POA: Diagnosis not present

## 2020-03-05 DIAGNOSIS — F419 Anxiety disorder, unspecified: Secondary | ICD-10-CM | POA: Diagnosis not present

## 2020-03-05 DIAGNOSIS — I10 Essential (primary) hypertension: Secondary | ICD-10-CM | POA: Diagnosis not present

## 2020-03-05 DIAGNOSIS — N182 Chronic kidney disease, stage 2 (mild): Secondary | ICD-10-CM | POA: Diagnosis not present

## 2020-03-05 DIAGNOSIS — R194 Change in bowel habit: Secondary | ICD-10-CM | POA: Diagnosis not present

## 2020-03-05 DIAGNOSIS — E785 Hyperlipidemia, unspecified: Secondary | ICD-10-CM | POA: Diagnosis not present

## 2020-03-06 NOTE — Progress Notes (Signed)
[facility]    Provider:  Dr Jaynee Eagles Requesting Provider: Delphina Cahill MD Primary Care Provider:  Celene Squibb, MD  CC:  Memory concerns  HPI:  Diana Hall is a 74 y.o. female here as requested by Leeanne Rio, MD for memory problems. PMHx postconcussive syndrome, osteoarthritis, major depressive disorder, low back pain, IBS, hypertension, diabetes type 2, dyslipidemia, breast cancer in 2002 stage I. I reviewed Dr. Juel Burrow notes: Patient reported chronic fatigue, like a batteries pulled that are unplugged, sleeping fine from 10 at night until 10 in the morning, labs have been good, nothing to show the degree of fatigue, Dr. [place] Crane did not think it sounded like sleep apnea or or cardiac fatigue, no pain, questioned if neuro or psych related because she also reported word finding, pronouncing things wrong that she never had issues with before, little headaches, had a brain scan a year ago, had a history of syncopal episode 4 years ago [facility] was in the hospital for 2 days and had a significant concussion.  Patient was seen in 2019: I reviewed notes. She fell and hit her head had a mechanical fall in the setting of dizziness and a UTI, she was diagnosed with concussion, she was not diagnosed with a seizure, and at that time she reported problems remembering things but also reported improving. Father had Alzheimer's in his 59s. In 2019 she also reported having short-term memory loss, forgetting where she put things, having to use a calendar more to find appointments, having to ask multiple times in same day about appointments and progressive decline. Husband took over the bills. At that time in 2019 she did not snore and denied excessive fatigue. MMSE at the time was 24 out of 30. At that time we ordered MRI of the brain, FDG PET scan and formal neurocognitive testing. MRI of the brain at that time was normal for age and FDG PET scan showed no evidence  for Alzheimer's or FTD. I did recommend formal memory testing however it does not appear that the patient ever did that.  In the last 2 weeks things are getting better. She feels it was the head injury, and she is more "together". She says she sleeps a lot. There is nothing to do, more boring, she loves to read but after 2-3 hours she is tired and she naps. She gets up in the morning and then naps a few hours later until. She does not snore. She says she feels awake and sleeps because she is bored. When she gets in the morning she feels refreshed. She has a general fatigue. They see Dr. Nevada Crane now. She denies any significant short-term memory, she still loses things, husband feels it is cyclical, they both feel she has depression and maybe seasonal affective disorder. Husband says he still feels there is an issue. Her father had Alzheimer's disease. She says there has been a lot deaths and been to the hospital. 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 Not doing things she enjoys. Difficult to get her to do anything and she goes back to bed.   Reviewed notes, labs and imaging from outside physicians, which showed:  Labs 11/20/2019: CBC normal, CMP with glucose elevated to 31, elevated AST 44, BUN 16, creatinine 0.86, elevated ALT 59, otherwise unremarkable CMP. Hemoglobin A1c 7.3..   MRI of the brain, personally reviewed images, normal for age.  FDG PET  Scan not consistent with Alz or FTD Review of Systems: Patient complains of symptoms per HPI as well as the following symptoms:depression. . Pertinent negatives and positives per HPI. All others negative.   Social History   Socioeconomic History  . Marital status: Married    Spouse name: Not on file  . Number of children: Not on file  . Years of education: Not on file  . Highest education level: Master's degree (e.g., MA, MS, MEng, MEd, MSW, MBA)  Occupational History  . Occupation: Retired   Tobacco Use  . Smoking status: Never Smoker  . Smokeless tobacco: Never Used  Substance and Sexual Activity  . Alcohol use: Yes    Comment: rare  . Drug use: No  . Sexual activity: Not on file  Other Topics Concern  . Not on file  Social History Narrative   Lives at home with husband and pets   Right handed   Caffeine: maybe 2-3 cups of tea    Social Determinants of Health   Financial Resource Strain:   . Difficulty of Paying Living Expenses: Not on file  Food Insecurity:   . Worried About Charity fundraiser in the Last Year: Not on file  . Ran Out of Food in the Last Year: Not on file  Transportation Needs:   . Lack of Transportation (Medical): Not on file  . Lack of Transportation (Non-Medical): Not on file  Physical Activity:   . Days of Exercise per Week: Not on file  . Minutes of Exercise per Session: Not on file  Stress:   . Feeling of Stress : Not on file  Social Connections:   . Frequency of Communication with Friends and Family: Not on file  . Frequency of Social Gatherings with Friends and Family: Not on file  . Attends Religious Services: Not on file  . Active Member of Clubs or Organizations: Not on file  . Attends Archivist Meetings: Not on file  . Marital Status: Not on file  Intimate Partner Violence:   . Fear of Current or Ex-Partner: Not on file  . Emotionally Abused: Not on file  . Physically Abused: Not on file  . Sexually Abused: Not on file    Family History  Problem Relation Age of Onset  . Breast cancer Mother 54  . Alzheimer's disease Father     Past Medical History:  Diagnosis Date  . Allergic rhinitis   . Benign essential hypertension   . Breast cancer, stage 1 ([place])   . Cancer [facility]) 2002   breast cancer  . Diabetes mellitus type 2 in obese ([place])   . Dyslipidemia   . Hypertension   . Irritable bowel syndrome with diarrhea   . Liver dysfunction   . Low back pain at multiple sites   . Major depressive disorder   .  Osteoarthritis   . Postconcussion syndrome     Patient Active Problem List   Diagnosis Date Noted  . Diabetes mellitus type 2 in obese ([place]) 11/06/2016  . Essential hypertension 11/06/2016  . Delirium 11/05/2016    Past Surgical History:  Procedure Laterality Date  . ABDOMINAL HYSTERECTOMY    . BREAST BIOPSY Right 08/30/2000  . BREAST LUMPECTOMY Right 2002  . CHOLECYSTECTOMY    . EXCISION / BIOPSY BREAST / NIPPLE / DUCT Right 09/13/2000    Current Outpatient Medications  Medication Sig Dispense Refill  . amLODipine (NORVASC) 2.5 MG tablet Take 2.5 mg by mouth daily.    Marland Kitchen  atorvastatin (LIPITOR) 40 MG tablet Take 40 mg by mouth at bedtime.    . DULoxetine (CYMBALTA) 60 MG capsule Take 60 mg by mouth 2 (two) times daily.     . fish oil-omega-3 fatty acids 1000 MG capsule Take 2 g by mouth 2 (two) times daily.     Marland Kitchen glimepiride (AMARYL) 4 MG tablet Take 8 mg by mouth daily with breakfast.    . loperamide (IMODIUM) 2 MG capsule Take by mouth 3 (three) times daily as needed for diarrhea or loose stools.    Marland Kitchen losartan (COZAAR) 100 MG tablet Take 100 mg by mouth daily.    . Multiple Vitamin (MULTIVITAMIN) capsule Take 1 capsule by mouth daily.    . Pancrelipase, Lip-Prot-Amyl, (CREON PO) Take 36,000 Units by mouth. Take 2 before each meal and 1 before snacks (9 tablets per day) for pancreatic insufficiency.    . sitaGLIPtin (JANUVIA) 50 MG tablet Take 50 mg by mouth daily.     No current facility-administered medications for this visit.    Allergies as of 03/07/2020 - Review Complete 03/07/2020  Allergen Reaction Noted  . Iodinated diagnostic agents Anaphylaxis 10/09/2011  . Penicillins Rash 10/09/2011  . Calcium citrate Diarrhea, Nausea And Vomiting, and Other (See Comments) 07/21/2017  . Hydrochlorothiazide Other (See Comments) 01/06/2017  . Hydrocodone-acetaminophen Itching 10/09/2011    Vitals: BP (!) 146/85 (BP Location: Right Arm, Patient Position: Sitting)   Pulse 81   Last Weight:  Wt Readings from Last 1 Encounters:  01/05/18 145 lb (65.8 kg)   Last Height:   Ht Readings from Last 1 Encounters:  01/05/18 5\' 4"  (1.626 m)     Physical exam: Exam: Gen: NAD, conversant, well nourised, well groomed                     CV: RRR, no MRG. No Carotid Bruits. No peripheral edema, warm, nontender Eyes: Conjunctivae clear without exudates or hemorrhage  Neuro: Detailed Neurologic Exam  Speech:    Speech is normal; fluent and spontaneous with normal comprehension.  Cognition:    The patient is oriented to person, place, and time;     MMSE - Mini Mental State Exam 03/07/2020  Orientation to time 4  Orientation to Place 4  Registration 3  Attention/ Calculation 1  Recall 3  Language- name 2 objects 2  Language- repeat 1  Language- follow 3 step command 3  Language- read & follow direction 1  Write a sentence 1  Copy design 0  Total score 23    Cranial Nerves:    The pupils are equal, round, and reactive to light.  Visual fields are full to threat. Extraocular movements are intact. Trigeminal sensation is intact and the muscles of mastication are normal. The face is symmetric. The palate elevates in the midline. Hearing intact. Voice is normal. Shoulder shrug is normal. The tongue has normal motion without fasciculations.   Coordination:    No dysmetria or ataxia  Gait:    Normal native gait  Motor Observation:    No asymmetry, no atrophy, and no involuntary movements noted. Tone:    Normal muscle tone.    Posture:    Posture is normal. normal erect    Strength:    Strength is V/V in the upper and lower limbs.      Sensation: intact to LT     Reflex Exam:  DTR's:    Deep tendon reflexes in the upper and lower extremities are normal bilaterally.  Toes:    The toes are downgoing bilaterally.   Clonus:    Clonus is absent.    Assessment/Plan:  Patient here for memory loss. We evaluated her in 2019 for the same, at that time  in 2019 she did not snore and denied excessive fatigue. MMSE was 24 out of 30. At that time we ordered MRI of the brain, FDG PET scan and formal neurocognitive testing. MRI of the brain was normal for age and FDG PET scan showed no evidence for Alzheimer's or FTD. I did recommend and order formal memory testing however it does not appear that the patient ever did that.  2 years ago MMSE 24/30, today 23/30 not consistent with dementia or a degenerative brain disorder.She has also been involved in estates of departed people and says she has managed that so appears she can perform complex ADLs which is again not consistent with a neurodegenerative brain disorder that would be going on for several years.  Also husband says her symptoms are is cyclical so I suspect mood disorder. But since they are here with concerns again I think formal memory testing would benefit.   Noemi Chapel referral, [facility]  Labs in 2019 unremarkable: b12, MMA, TSH, homocysteine, had a high normal tsh 4.560.  B12 was low normal. Will recheck TSH and B12 today.   Orders Placed This Encounter  Procedures  . B12 and Folate Panel  . Methylmalonic acid, serum  . TSH  . T4, Free  . Ambulatory referral to Neuropsychology  . Ambulatory referral to Psychiatry   No orders of the defined types were placed in this encounter.   Cc: Leeanne Rio, MD,  Celene Squibb, MD  Sarina Ill, MD  [facility] [address] [place], [place] [address]  Phone [phone] Fax [phone]  I spent 60 minutes of face-to-face and non-face-to-face time with patient on the  1. Memory loss   2. Other fatigue   3. Depression, unspecified depression type    diagnosis.  This included previsit chart review, lab review, study review, order entry, electronic health record documentation, patient education on the different diagnostic and therapeutic options, counseling and coordination of  care, risks and benefits of management, compliance, or risk factor reduction

## 2020-03-07 ENCOUNTER — Ambulatory Visit: Payer: Medicare PPO | Admitting: Neurology

## 2020-03-07 ENCOUNTER — Encounter: Payer: Self-pay | Admitting: Neurology

## 2020-03-07 VITALS — BP 146/85 | HR 81

## 2020-03-07 DIAGNOSIS — R5383 Other fatigue: Secondary | ICD-10-CM

## 2020-03-07 DIAGNOSIS — R413 Other amnesia: Secondary | ICD-10-CM | POA: Diagnosis not present

## 2020-03-07 DIAGNOSIS — F32A Depression, unspecified: Secondary | ICD-10-CM | POA: Diagnosis not present

## 2020-03-07 NOTE — Patient Instructions (Addendum)
Formal memory testing - [facility] today   Memory Compensation Strategies  1. Use "WARM" strategy.  W= write it down  A= associate it  R= repeat it  M= make a mental note  2.   You can keep a Social worker.  Use a 3-ring notebook with sections for the following: calendar, important names and phone numbers,  medications, doctors' names/phone numbers, lists/reminders, and a section to journal what you did  each day.   3.    Use a calendar to write appointments down.  4.    Write yourself a schedule for the day.  This can be placed on the calendar or in a separate section of the Memory Notebook.  Keeping a  regular schedule can help memory.  5.    Use medication organizer with sections for each day or morning/evening pills.  You may need help loading it  6.    Keep a basket, or pegboard by the door.  Place items that you need to take out with you in the basket or on the pegboard.  You may also want to  include a message board for reminders.  7.    Use sticky notes.  Place sticky notes with reminders in a place where the task is performed.  For example: " turn off the  stove" placed by the stove, "lock the door" placed on the door at eye level, " take your medications" on  the bathroom mirror or by the place where you normally take your medications.  8.    Use alarms/timers.  Use while cooking to remind yourself to check on food or as a reminder to take your medicine, or as a  reminder to make a call, or as a reminder to perform another task, etc.  Major Depressive Disorder, Adult Major depressive disorder (MDD) is a mental health condition. MDD often makes you feel sad, hopeless, or helpless. MDD can also cause symptoms in your body. MDD can affect your:  Work.  School.  Relationships.  Other normal activities. MDD can range from mild to very bad. It may occur once (single episode MDD). It can also occur many times (recurrent  MDD). The main symptoms of MDD often include:  Feeling sad, depressed, or irritable most of the time.  Loss of interest. MDD symptoms also include:  Sleeping too much or too little.  Eating too much or too little.  A change in your weight.  Feeling tired (fatigue) or having low energy.  Feeling worthless.  Feeling guilty.  Trouble making decisions.  Trouble thinking clearly.  Thoughts of suicide or harming others.  Feeling weak.  Feeling agitated.  Keeping yourself from being around other people (isolation). Follow these instructions at home: Activity  Do these things as told by your doctor: ? Go back to your normal activities. ? Exercise regularly. ? Spend time outdoors. Alcohol  Talk with your doctor about how alcohol can affect your antidepressant medicines.  Do not drink alcohol. Or, limit how much alcohol you drink. ? This means no more than 1 drink a day for nonpregnant women and 2 drinks a day for men. One drink equals one of these:  12 oz of beer.  5 oz of wine.  1 oz of hard liquor. General instructions  Take over-the-counter and prescription medicines only as told by your doctor.  Eat a healthy diet.  Get plenty of sleep.  Find activities that you enjoy. Make time to  do them.  Think about joining a support group. Your doctor may be able to suggest a group for you.  Keep all follow-up visits as told by your doctor. This is important. Where to find more information:  [employer] on Mental Illness: ? www.nami.[place]: ? https://carter.com/  National Suicide Prevention Lifeline: ? (306) 109-2197. This is free, 24-hour help. Contact a doctor if:  Your symptoms get worse.  You have new symptoms. Get help right away if:  You self-harm.  You see, hear, taste, smell, or feel things that are not present (hallucinate). If you ever feel like you may hurt yourself or others, or have thoughts  about taking your own life, get help right away. You can go to your nearest emergency department or call:  Your local emergency services (911 in the [place]  A suicide crisis helpline, such as the National Suicide Prevention Lifeline: ? 978-648-3902. This is open 24 hours a day. This information is not intended to replace advice given to you by your health care provider. Make sure you discuss any questions you have with your health care provider. Document Revised: 04/23/2017 Document Reviewed: 01/26/2016 Elsevier Patient Education  2020 [employer].

## 2020-03-07 NOTE — Progress Notes (Signed)
Epworth Sleepiness Scale 0= would never doze 1= slight chance of dozing 2= moderate chance of dozing 3= high chance of dozing  Sitting and reading: 0 Watching TV: 0 Sitting inactive in a public place (ex. Theater or meeting): 0 As a passenger in a car for an hour without a break: 2 Lying down to rest in the afternoon: 3 Sitting and talking to someone: 0 Sitting quietly after lunch (no alcohol): 0 In a car, while stopped in traffic: 0 Total:5

## 2020-03-12 LAB — T4, FREE: Free T4: 1 ng/dL (ref 0.82–1.77)

## 2020-03-12 LAB — TSH: TSH: 1.08 u[IU]/mL (ref 0.450–4.500)

## 2020-03-12 LAB — METHYLMALONIC ACID, SERUM: Methylmalonic Acid: 208 nmol/L (ref 0–378)

## 2020-03-12 LAB — B12 AND FOLATE PANEL
Folate: 6.1 ng/mL (ref >3.0)
Vitamin B-12: 383 pg/mL (ref 232–1245)

## 2020-03-12 NOTE — Progress Notes (Signed)
Please call patient and advise her that her labs were fine.  We checked vitamin B12, folate, thyroid function.

## 2020-03-13 ENCOUNTER — Telehealth: Payer: Self-pay | Admitting: *Deleted

## 2020-03-13 NOTE — Telephone Encounter (Signed)
I spoke with pt's husband [place] (on [place]) and discussed lab results. He verbalized understanding and asked about the referrals to psychology and psychiatry. I advised they were sent but he would need to call [facility] to schedule the appointment. I also advised that the referral to Dr Sima Matas stated the patient was called and said she did not need the appointment for testing. The pt's husband stated he would discuss this with the patient and he is aware that they would have to call back if they want to schedule. He verbalized appreciation for the call.

## 2020-03-13 NOTE — Telephone Encounter (Signed)
-----   Message from Star Age, MD sent at 03/12/2020  4:57 PM EDT ----- Please call patient and advise her that her labs were fine.  We checked vitamin B12, folate, thyroid function.

## 2020-03-15 DIAGNOSIS — C50911 Malignant neoplasm of unspecified site of right female breast: Secondary | ICD-10-CM | POA: Diagnosis not present

## 2020-03-28 DIAGNOSIS — H6122 Impacted cerumen, left ear: Secondary | ICD-10-CM | POA: Diagnosis not present

## 2020-03-28 DIAGNOSIS — H9113 Presbycusis, bilateral: Secondary | ICD-10-CM | POA: Diagnosis not present

## 2020-06-19 DIAGNOSIS — E785 Hyperlipidemia, unspecified: Secondary | ICD-10-CM | POA: Diagnosis not present

## 2020-06-19 DIAGNOSIS — Z0001 Encounter for general adult medical examination with abnormal findings: Secondary | ICD-10-CM | POA: Diagnosis not present

## 2020-06-19 DIAGNOSIS — N182 Chronic kidney disease, stage 2 (mild): Secondary | ICD-10-CM | POA: Diagnosis not present

## 2020-06-19 DIAGNOSIS — Z0189 Encounter for other specified special examinations: Secondary | ICD-10-CM | POA: Diagnosis not present

## 2020-06-19 DIAGNOSIS — E1121 Type 2 diabetes mellitus with diabetic nephropathy: Secondary | ICD-10-CM | POA: Diagnosis not present

## 2020-06-19 DIAGNOSIS — I10 Essential (primary) hypertension: Secondary | ICD-10-CM | POA: Diagnosis not present

## 2020-06-19 DIAGNOSIS — G3184 Mild cognitive impairment, so stated: Secondary | ICD-10-CM | POA: Diagnosis not present

## 2020-06-19 DIAGNOSIS — N189 Chronic kidney disease, unspecified: Secondary | ICD-10-CM | POA: Diagnosis not present

## 2020-06-19 DIAGNOSIS — J302 Other seasonal allergic rhinitis: Secondary | ICD-10-CM | POA: Diagnosis not present

## 2020-07-17 DIAGNOSIS — K219 Gastro-esophageal reflux disease without esophagitis: Secondary | ICD-10-CM | POA: Diagnosis not present

## 2020-07-17 DIAGNOSIS — R718 Other abnormality of red blood cells: Secondary | ICD-10-CM | POA: Diagnosis not present

## 2020-07-17 DIAGNOSIS — F419 Anxiety disorder, unspecified: Secondary | ICD-10-CM | POA: Diagnosis not present

## 2020-07-17 DIAGNOSIS — N182 Chronic kidney disease, stage 2 (mild): Secondary | ICD-10-CM | POA: Diagnosis not present

## 2020-07-17 DIAGNOSIS — I10 Essential (primary) hypertension: Secondary | ICD-10-CM | POA: Diagnosis not present

## 2020-07-17 DIAGNOSIS — E1121 Type 2 diabetes mellitus with diabetic nephropathy: Secondary | ICD-10-CM | POA: Diagnosis not present

## 2020-07-17 DIAGNOSIS — M25512 Pain in left shoulder: Secondary | ICD-10-CM | POA: Diagnosis not present

## 2020-07-17 DIAGNOSIS — E785 Hyperlipidemia, unspecified: Secondary | ICD-10-CM | POA: Diagnosis not present

## 2020-07-17 DIAGNOSIS — R194 Change in bowel habit: Secondary | ICD-10-CM | POA: Diagnosis not present

## 2020-08-08 DIAGNOSIS — I10 Essential (primary) hypertension: Secondary | ICD-10-CM | POA: Diagnosis not present

## 2020-08-08 DIAGNOSIS — E1121 Type 2 diabetes mellitus with diabetic nephropathy: Secondary | ICD-10-CM | POA: Diagnosis not present

## 2020-08-08 DIAGNOSIS — R194 Change in bowel habit: Secondary | ICD-10-CM | POA: Diagnosis not present

## 2020-08-08 DIAGNOSIS — F419 Anxiety disorder, unspecified: Secondary | ICD-10-CM | POA: Diagnosis not present

## 2020-08-08 DIAGNOSIS — E785 Hyperlipidemia, unspecified: Secondary | ICD-10-CM | POA: Diagnosis not present

## 2020-08-08 DIAGNOSIS — M25512 Pain in left shoulder: Secondary | ICD-10-CM | POA: Diagnosis not present

## 2020-08-08 DIAGNOSIS — R718 Other abnormality of red blood cells: Secondary | ICD-10-CM | POA: Diagnosis not present

## 2020-08-08 DIAGNOSIS — N182 Chronic kidney disease, stage 2 (mild): Secondary | ICD-10-CM | POA: Diagnosis not present

## 2020-08-08 DIAGNOSIS — K219 Gastro-esophageal reflux disease without esophagitis: Secondary | ICD-10-CM | POA: Diagnosis not present

## 2020-08-29 DIAGNOSIS — M545 Low back pain, unspecified: Secondary | ICD-10-CM | POA: Diagnosis not present

## 2020-08-29 DIAGNOSIS — I10 Essential (primary) hypertension: Secondary | ICD-10-CM | POA: Diagnosis not present

## 2020-08-29 DIAGNOSIS — E1121 Type 2 diabetes mellitus with diabetic nephropathy: Secondary | ICD-10-CM | POA: Diagnosis not present

## 2020-09-09 NOTE — Telephone Encounter (Signed)
I called the patient's husband and LVM (ok per DPR) advising we received his message about the patient.  I let him know that the message was sent to Dr. Jaynee Eagles but that she was with patients and since the symptoms appear to be new and she has not been evaluated for this before, we recommend she go to the ER for evaluation and to rule out TIA or seizures as this cannot be diagnosed over the phone.  I left the office phone number in the message.  I also advised him to try to call again tomorrow unless Dr. Jaynee Eagles messages by then.  We can certainly get patient scheduled for an appointment but she should be seen by ER first.
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
## 2020-09-10 NOTE — Telephone Encounter (Signed)
I was able to speak with husband.  They had not taken her to the ER nor did they get our messages.  He scheduled an appointment with Dr. Jaynee Eagles tomorrow April 20 at 1 PM.  Arrival [phone].  I also advised that if she has an episode again before then they really should call 911 and take her to the hospital.  He verbalized understanding and appreciation for the call.

## 2020-09-11 ENCOUNTER — Encounter: Payer: Self-pay | Admitting: Neurology

## 2020-09-11 ENCOUNTER — Ambulatory Visit: Payer: Medicare PPO | Admitting: Neurology

## 2020-09-11 VITALS — BP 143/75 | HR 89 | Ht 64.0 in | Wt 139.0 lb

## 2020-09-11 DIAGNOSIS — R419 Unspecified symptoms and signs involving cognitive functions and awareness: Secondary | ICD-10-CM

## 2020-09-11 DIAGNOSIS — G459 Transient cerebral ischemic attack, unspecified: Secondary | ICD-10-CM | POA: Diagnosis not present

## 2020-09-11 MED ORDER — ASPIRIN EC 81 MG PO TBEC: 81.0000 mg | DELAYED_RELEASE_TABLET | Freq: Every day | ORAL | 11 refills | Status: AC

## 2020-09-11 MED ORDER — PREDNISONE 50 MG PO TABS: ORAL_TABLET | ORAL | 0 refills | Status: DC
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
## 2020-09-11 NOTE — Patient Instructions (Addendum)
MRI of the brain and blood vessels Want cholesterol LDL < 70, I will contact Dr. Nevada Crane Carotid dopplers Echocardiogram EEG Start Aspirin 81mg     Transient Ischemic Attack A transient ischemic attack (TIA) causes the same symptoms as a stroke, but the symptoms go away quickly. A TIA happens when blood flow to the brain is blocked. Having a TIA means you may be at risk for a stroke. A TIA is a medical emergency. What are the causes? A TIA is caused by a blocked artery in the head or neck. This means the brain does not get the blood supply it needs. A blockage can be caused by:  Fatty buildup in an artery in the head or neck.  A blood clot.  A tear in an artery.  Irritation and swelling (inflammation) of an artery. Sometimes the cause is not known. What increases the risk? Certain things may make you more likely to have a TIA. Some of these are things that you can change, such as:  Being very overweight.  Using products that have nicotine or tobacco.  Taking birth control pills.  Not being active.  Drinking too much alcohol.  Using drugs. Health conditions that may increase your risk include:  High blood pressure.  High cholesterol.  Diabetes.  Heart disease.  A heartbeat that is not regular (atrial fibrillation).  Sickle cell disease.  Sleep problems (sleep apnea).  Long-term diseases that cause irritation and swelling.  Problems with blood clotting. Other risk factors include:  Being over the age of 68.  Being female.  Having a family history of stroke.  Having had blood clots, stroke, TIA, or heart attack in the past.  Having a history of high blood pressure when pregnant (preeclampsia).  Very bad headaches (migraines). What are the signs or symptoms? The symptoms of a TIA are like those of a stroke. They can include:  Weakness or loss of feeling in your face, arm, or leg. This often happens on one side of your body.  Trouble walking.  Trouble  moving your arms or legs.  Trouble talking or understanding what people are saying.  Problems with how you see.  Feeling dizzy.  Feeling confused.  Loss of balance or coordination.  Feeling like you may vomit (nausea) or you vomit.  Having a very bad headache. If you can, note what time you started to have symptoms. Tell your doctor.   How is this treated? The goal of treatment is to lower the risk for a stroke. This may include:  Changes to diet and lifestyle, such as getting regular exercise and stopping smoking.  Taking medicines to: ? Thin the blood. ? Lower blood pressure. ? Lower cholesterol.  Treating other health conditions, such as diabetes. If testing shows that an artery in your brain is narrow, your doctor may recommend a procedure to:  Take the blockage out of your artery (carotid endarterectomy).  Open or widen an artery in your neck (carotid angioplasty and stenting). Follow these instructions at home: Medicines  Take over-the-counter and prescription medicines only as told by your doctor.  If you were told to take aspirin or another medicine to thin your blood, take it exactly as told by your doctor. ? Taking too much of the medicine can cause bleeding. ? Taking too little of the medicine may not work to treat the problem. Eating and drinking  Eat 5 or more servings of fruits and vegetables each day.  Follow instructions from your doctor about your diet.  You may need to follow a certain diet to help lower your risk of a stroke. You may need to: ? Eat a diet that is low in fat and salt. ? Eat foods with a lot of fiber. ? Limit carbohydrates and sugar.  If you drink alcohol: ? Limit how much you have to:  0-1 drink a day for women who are not pregnant.  0-2 drinks a day for men. ? Know how much alcohol is in a drink. In the [place] one drink equals one 12 oz bottle of beer (319mL), one 5 oz glass of wine (178mL), or one 1 oz glass of hard liquor  (50mL).   General instructions  Keep a healthy weight.  Try to get at least 30 minutes of exercise on most days.  Get treatment if you have sleep problems.  Do not smoke or use any products that contain nicotine or tobacco. If you need help quitting, ask your doctor.  Do not use drugs.  Keep all follow-up visits. Where to find more information  American Stroke Association: www.stroke.org Get help right away if:  You have chest pain.  You have a heartbeat that is not regular.  You have any signs of a stroke. "BE FAST" is an easy way to remember the main warning signs: ? B - Balance. Dizziness, sudden trouble walking, or loss of balance. ? E - Eyes. Trouble seeing or a change in how you see. ? F - Face. Sudden weakness or loss of feeling of the face. The face or eyelid may droop on one side. ? A - Arms. Weakness or loss of feeling in an arm. This happens all of a sudden and most often on one side of the body. ? S - Speech. Sudden trouble speaking, slurred speech, or trouble understanding what people say. ? T - Time. Time to call emergency services. Write down what time symptoms started.  You have other signs of a stroke, such as: ? A sudden, very bad headache with no known cause. ? Feeling like you may vomit. ? Vomiting. ? A seizure. These symptoms may be an emergency. Get help right away. Call your local emergency services (911 in the [place]  Do not wait to see if the symptoms will go away.  Do not drive yourself to the hospital. Summary  A transient ischemic attack (TIA) happens when an artery in the head or neck is blocked. This causes the same symptoms as a stroke. The symptoms go away quickly.  A TIA is a medical emergency. Get help right away, even if your symptoms go away.  Having a TIA means that you may be at risk for a stroke. Taking medicines and making diet and lifestyle changes can help to prevent a stroke. This information is not intended to replace advice  given to you by your health care provider. Make sure you discuss any questions you have with your health care provider. Document Revised: 12/05/2019 Document Reviewed: 12/05/2019 Elsevier Patient Education  [employer].

## 2020-09-12 LAB — BASIC METABOLIC PANEL
BUN/Creatinine Ratio: 16 (ref 12–28)
BUN: 16 mg/dL (ref 8–27)
CO2: 23 mmol/L (ref 20–29)
Calcium: 9.5 mg/dL (ref 8.7–10.3)
Chloride: 98 mmol/L (ref 96–106)
Creatinine, Ser: 1 mg/dL (ref 0.57–1.00)
Glucose: 242 mg/dL — ABNORMAL HIGH (ref 65–99)
Potassium: 3.2 mmol/L — ABNORMAL LOW (ref 3.5–5.2)
Sodium: 139 mmol/L (ref 134–144)
eGFR: 59 mL/min/{1.73_m2} — ABNORMAL LOW (ref >59)

## 2020-09-16 ENCOUNTER — Telehealth: Payer: Self-pay | Admitting: Neurology

## 2020-09-16 NOTE — Telephone Encounter (Signed)
Diana Hall: [id number] (exp. 09/16/20 to 10/16/20)   Patient is scheduled at [facility] for 10/01/20 to arrive at 12:45 pm. I left a voicemail on patient phone about her appointment. I also left their number of [phone] incase she needs to r/s.

## 2020-09-17 ENCOUNTER — Other Ambulatory Visit: Payer: Self-pay | Admitting: Neurology

## 2020-09-17 DIAGNOSIS — G459 Transient cerebral ischemic attack, unspecified: Secondary | ICD-10-CM

## 2020-09-20 ENCOUNTER — Encounter (HOSPITAL_COMMUNITY): Payer: Medicare PPO

## 2020-09-22 DIAGNOSIS — G459 Transient cerebral ischemic attack, unspecified: Secondary | ICD-10-CM

## 2020-09-22 DIAGNOSIS — R419 Unspecified symptoms and signs involving cognitive functions and awareness: Secondary | ICD-10-CM

## 2020-09-23 ENCOUNTER — Other Ambulatory Visit: Payer: Self-pay | Admitting: *Deleted

## 2020-09-23 DIAGNOSIS — R413 Other amnesia: Secondary | ICD-10-CM

## 2020-09-24 NOTE — Telephone Encounter (Signed)
Both orders have been placed again for Carotid ultrasound and Echo at [facility].

## 2020-09-24 NOTE — Telephone Encounter (Signed)
Thank you! I called the scheduling phone number at [facility] for Carotid & echo at [phone] informing them that both of them are ready to be scheduled together.

## 2020-09-24 NOTE — Telephone Encounter (Signed)
Vaughan Basta from [employer] called me back and she stated it sounds like the patient wants to have the Echo and the Carotid all in the same location and [address] does not do Echo just Carotid. A new order needs to be put in for a Echo and Carotid for [facility] because they do both there. Christy sent me the message over Teams where she spoke with Hinton Dyer.

## 2020-09-24 NOTE — Telephone Encounter (Signed)
I am not sure why it has been canceled the only thing I saw under the cancelling note from the appointment is [facility] @ Dr Cathren Laine office to cxl appt.  I called Butch Penny at [facility] she stated it looked like the order needed to be switched to a vascular carotid and switched to [employer] [employer]. She gave me Vaughan Basta phone number who canceled the appointment that works on [address] of [phone]. I left her a voicemail to call my direct line back to see if she remembers what Hinton Dyer had told her.

## 2020-09-25 ENCOUNTER — Telehealth: Payer: Self-pay | Admitting: Neurology

## 2020-09-25 NOTE — Telephone Encounter (Signed)
[employer] no auth require for Carotid. Spoke to [place] w/[employer] Ref # [id number] DHWY for Echo Auth: [id number] (exp. 09/17/20 to 10/17/20)  Patient is scheduled to have both of them done at [facility] for 10/09/20.

## 2020-10-01 ENCOUNTER — Other Ambulatory Visit: Payer: Self-pay

## 2020-10-01 ENCOUNTER — Ambulatory Visit (HOSPITAL_COMMUNITY)
Admission: RE | Admit: 2020-10-01 | Discharge: 2020-10-01 | Disposition: A | Discharge status: Home / Self Care | Payer: Medicare PPO | Source: Ambulatory Visit | Attending: Neurology | Admitting: Neurology

## 2020-10-01 DIAGNOSIS — G459 Transient cerebral ischemic attack, unspecified: Secondary | ICD-10-CM | POA: Diagnosis not present

## 2020-10-01 DIAGNOSIS — R419 Unspecified symptoms and signs involving cognitive functions and awareness: Secondary | ICD-10-CM | POA: Insufficient documentation

## 2020-10-01 DIAGNOSIS — R4182 Altered mental status, unspecified: Secondary | ICD-10-CM | POA: Diagnosis not present

## 2020-10-01 DIAGNOSIS — C50919 Malignant neoplasm of unspecified site of unspecified female breast: Secondary | ICD-10-CM | POA: Diagnosis not present

## 2020-10-01 MED ORDER — GADOBUTROL 1 MMOL/ML IV SOLN
7.0000 mL | Freq: Once | INTRAVENOUS | Status: AC | PRN
  Administered 2020-10-01: 7 mL via INTRAVENOUS

## 2020-10-07 ENCOUNTER — Ambulatory Visit: Payer: Medicare PPO

## 2020-10-07 DIAGNOSIS — G459 Transient cerebral ischemic attack, unspecified: Secondary | ICD-10-CM

## 2020-10-07 DIAGNOSIS — R410 Disorientation, unspecified: Secondary | ICD-10-CM | POA: Diagnosis not present

## 2020-10-07 DIAGNOSIS — R419 Unspecified symptoms and signs involving cognitive functions and awareness: Secondary | ICD-10-CM

## 2020-10-09 ENCOUNTER — Other Ambulatory Visit: Payer: Self-pay

## 2020-10-09 ENCOUNTER — Other Ambulatory Visit (HOSPITAL_COMMUNITY): Payer: Medicare PPO

## 2020-10-09 ENCOUNTER — Ambulatory Visit (HOSPITAL_BASED_OUTPATIENT_CLINIC_OR_DEPARTMENT_OTHER)
Admission: RE | Admit: 2020-10-09 | Discharge: 2020-10-09 | Disposition: A | Discharge status: Home / Self Care | Payer: Medicare PPO | Source: Ambulatory Visit | Attending: Neurology | Admitting: Neurology

## 2020-10-09 ENCOUNTER — Ambulatory Visit (HOSPITAL_COMMUNITY): Payer: Medicare PPO

## 2020-10-09 ENCOUNTER — Ambulatory Visit (HOSPITAL_COMMUNITY)
Admission: RE | Admit: 2020-10-09 | Discharge: 2020-10-09 | Disposition: A | Discharge status: Home / Self Care | Payer: Medicare PPO | Source: Ambulatory Visit | Attending: Neurology | Admitting: Neurology

## 2020-10-09 DIAGNOSIS — G459 Transient cerebral ischemic attack, unspecified: Secondary | ICD-10-CM

## 2020-10-09 DIAGNOSIS — I119 Hypertensive heart disease without heart failure: Secondary | ICD-10-CM | POA: Insufficient documentation

## 2020-10-09 DIAGNOSIS — R419 Unspecified symptoms and signs involving cognitive functions and awareness: Secondary | ICD-10-CM | POA: Insufficient documentation

## 2020-10-09 DIAGNOSIS — E119 Type 2 diabetes mellitus without complications: Secondary | ICD-10-CM | POA: Insufficient documentation

## 2020-10-09 DIAGNOSIS — I700 Atherosclerosis of aorta: Secondary | ICD-10-CM | POA: Insufficient documentation

## 2020-10-09 LAB — ECHOCARDIOGRAM COMPLETE BUBBLE STUDY
Area-P 1/2: 4.49 cm2
Calc EF: 62.4 %
S' Lateral: 2.2 cm
Single Plane A2C EF: 69.2 %
Single Plane A4C EF: 53.1 %

## 2020-10-09 NOTE — Progress Notes (Signed)
  Echocardiogram 2D Echocardiogram has been performed.  Diana Hall 10/09/2020, 3:29 PM

## 2020-10-10 ENCOUNTER — Telehealth: Payer: Self-pay | Admitting: *Deleted

## 2020-10-10 DIAGNOSIS — I10 Essential (primary) hypertension: Secondary | ICD-10-CM | POA: Diagnosis not present

## 2020-10-10 DIAGNOSIS — E1121 Type 2 diabetes mellitus with diabetic nephropathy: Secondary | ICD-10-CM | POA: Diagnosis not present

## 2020-10-10 NOTE — Telephone Encounter (Signed)
Spoke with patient's husband Reardan (on [place]) and advised carotid Dopplers are normal.  He verbalized understanding and appreciation for the call.  They did receive the results of the bubble study.

## 2020-10-10 NOTE — Telephone Encounter (Signed)
-----   Message from Melvenia Beam, MD sent at 10/09/2020  7:40 PM EDT ----- Normal carotid dopplers, thanks

## 2020-10-24 NOTE — Procedures (Signed)
   HISTORY: 75 year old female, presented with memory loss, with sudden onset speech difficulty TECHNIQUE:  This is a routine 16 channel EEG recording with one channel devoted to a limited EKG recording.  It was performed during wakefulness, drowsiness and asleep.  Hyperventilation and photic stimulation were performed as activating procedures.  There are frequent muscle and movement artifact noted.  Upon maximum arousal, posterior dominant waking rhythm consistent of rhythmic alpha range activity, with frequency of 8 hz. Activities are symmetric over the bilateral posterior derivations and attenuated with eye opening.  Hyperventilation produced mild/moderate buildup with higher amplitude and the slower activities noted.  Photic stimulation did not alter the tracing.  During EEG recording, patient developed drowsiness and no deeper stage of sleep was achieved  During EEG recording, there was no epileptiform discharge noted.  EKG demonstrate sinus rhythm, with heart rate of 80 bpm  CONCLUSION: This is a  normal awake EEG.  There is no electrodiagnostic evidence of epileptiform discharge.  Marcial Pacas, M.D. Ph.D.  [facility] [address], [place] [address] Phone: [phone] Fax:      [phone]

## 2020-10-29 ENCOUNTER — Telehealth: Payer: Self-pay | Admitting: *Deleted

## 2020-10-29 NOTE — Telephone Encounter (Signed)
-----   Message from Penni Bombard, MD sent at 10/29/2020 10:42 AM EDT ----- Normal EEG. -VRP

## 2020-10-29 NOTE — Telephone Encounter (Signed)
Spoke with patient and advised her EEG is normal. Pt mentioned she was considering the formal memory testing. I have given her the number to Dr Ferne Coe office to call and schedule. Her questions were answered during the call. Patient also mentioned that she had discussed with Dr Jaynee Eagles being able to do all testing at [facility] but it did not work out. I apologized to the patient and let her know that sometimes depending on the particular test that was ordered there may be specific locations for testing. Patient verbalized understanding and wanted Dr Jaynee Eagles to know. She verbalized appreciation for the call.

## 2020-10-30 ENCOUNTER — Encounter: Payer: Self-pay | Admitting: Adult Health

## 2020-11-15 DIAGNOSIS — R309 Painful micturition, unspecified: Secondary | ICD-10-CM | POA: Diagnosis not present

## 2020-11-26 DIAGNOSIS — E1121 Type 2 diabetes mellitus with diabetic nephropathy: Secondary | ICD-10-CM | POA: Diagnosis not present

## 2020-11-26 DIAGNOSIS — I10 Essential (primary) hypertension: Secondary | ICD-10-CM | POA: Diagnosis not present

## 2020-11-26 DIAGNOSIS — E785 Hyperlipidemia, unspecified: Secondary | ICD-10-CM | POA: Diagnosis not present

## 2020-11-29 DIAGNOSIS — E785 Hyperlipidemia, unspecified: Secondary | ICD-10-CM | POA: Diagnosis not present

## 2020-11-29 DIAGNOSIS — K219 Gastro-esophageal reflux disease without esophagitis: Secondary | ICD-10-CM | POA: Diagnosis not present

## 2020-11-29 DIAGNOSIS — R809 Proteinuria, unspecified: Secondary | ICD-10-CM | POA: Diagnosis not present

## 2020-11-29 DIAGNOSIS — N182 Chronic kidney disease, stage 2 (mild): Secondary | ICD-10-CM | POA: Diagnosis not present

## 2020-11-29 DIAGNOSIS — E1121 Type 2 diabetes mellitus with diabetic nephropathy: Secondary | ICD-10-CM | POA: Diagnosis not present

## 2020-11-29 DIAGNOSIS — M545 Low back pain, unspecified: Secondary | ICD-10-CM | POA: Diagnosis not present

## 2020-11-29 DIAGNOSIS — J302 Other seasonal allergic rhinitis: Secondary | ICD-10-CM | POA: Diagnosis not present

## 2020-11-29 DIAGNOSIS — R945 Abnormal results of liver function studies: Secondary | ICD-10-CM | POA: Diagnosis not present

## 2020-11-29 DIAGNOSIS — I10 Essential (primary) hypertension: Secondary | ICD-10-CM | POA: Diagnosis not present

## 2020-12-26 ENCOUNTER — Other Ambulatory Visit: Payer: Self-pay

## 2020-12-26 ENCOUNTER — Other Ambulatory Visit (HOSPITAL_COMMUNITY): Payer: Self-pay | Admitting: Family Medicine

## 2020-12-26 ENCOUNTER — Ambulatory Visit (HOSPITAL_COMMUNITY)
Admission: RE | Admit: 2020-12-26 | Discharge: 2020-12-26 | Disposition: A | Discharge status: Home / Self Care | Payer: Medicare PPO | Source: Ambulatory Visit | Attending: Family Medicine | Admitting: Family Medicine

## 2020-12-26 DIAGNOSIS — R0781 Pleurodynia: Secondary | ICD-10-CM

## 2020-12-26 DIAGNOSIS — K449 Diaphragmatic hernia without obstruction or gangrene: Secondary | ICD-10-CM | POA: Diagnosis not present

## 2021-01-13 ENCOUNTER — Other Ambulatory Visit: Payer: Self-pay | Admitting: Internal Medicine

## 2021-01-13 DIAGNOSIS — Z1231 Encounter for screening mammogram for malignant neoplasm of breast: Secondary | ICD-10-CM

## 2021-01-15 ENCOUNTER — Ambulatory Visit: Payer: Medicare PPO | Admitting: Adult Health

## 2021-02-04 ENCOUNTER — Ambulatory Visit: Payer: Medicare PPO

## 2021-02-04 DIAGNOSIS — R1030 Lower abdominal pain, unspecified: Secondary | ICD-10-CM | POA: Diagnosis not present

## 2021-02-04 DIAGNOSIS — R5383 Other fatigue: Secondary | ICD-10-CM | POA: Diagnosis not present

## 2021-02-04 DIAGNOSIS — R350 Frequency of micturition: Secondary | ICD-10-CM | POA: Diagnosis not present

## 2021-02-04 DIAGNOSIS — N390 Urinary tract infection, site not specified: Secondary | ICD-10-CM | POA: Diagnosis not present

## 2021-02-04 DIAGNOSIS — R109 Unspecified abdominal pain: Secondary | ICD-10-CM | POA: Diagnosis not present

## 2021-02-18 DIAGNOSIS — Z23 Encounter for immunization: Secondary | ICD-10-CM | POA: Diagnosis not present

## 2021-02-28 ENCOUNTER — Ambulatory Visit
Admission: RE | Admit: 2021-02-28 | Discharge: 2021-02-28 | Disposition: A | Discharge status: Home / Self Care | Payer: Medicare PPO | Source: Ambulatory Visit | Attending: Internal Medicine | Admitting: Internal Medicine

## 2021-02-28 ENCOUNTER — Other Ambulatory Visit: Payer: Self-pay

## 2021-02-28 DIAGNOSIS — Z1231 Encounter for screening mammogram for malignant neoplasm of breast: Secondary | ICD-10-CM

## 2021-03-06 DIAGNOSIS — E1121 Type 2 diabetes mellitus with diabetic nephropathy: Secondary | ICD-10-CM | POA: Diagnosis not present

## 2021-03-06 DIAGNOSIS — I10 Essential (primary) hypertension: Secondary | ICD-10-CM | POA: Diagnosis not present

## 2021-03-13 DIAGNOSIS — N182 Chronic kidney disease, stage 2 (mild): Secondary | ICD-10-CM | POA: Diagnosis not present

## 2021-03-13 DIAGNOSIS — I10 Essential (primary) hypertension: Secondary | ICD-10-CM | POA: Diagnosis not present

## 2021-03-13 DIAGNOSIS — E782 Mixed hyperlipidemia: Secondary | ICD-10-CM | POA: Diagnosis not present

## 2021-03-13 DIAGNOSIS — R1032 Left lower quadrant pain: Secondary | ICD-10-CM | POA: Diagnosis not present

## 2021-03-13 DIAGNOSIS — N390 Urinary tract infection, site not specified: Secondary | ICD-10-CM | POA: Diagnosis not present

## 2021-03-13 DIAGNOSIS — E1121 Type 2 diabetes mellitus with diabetic nephropathy: Secondary | ICD-10-CM | POA: Diagnosis not present

## 2021-03-13 DIAGNOSIS — R350 Frequency of micturition: Secondary | ICD-10-CM | POA: Diagnosis not present

## 2021-03-13 DIAGNOSIS — R945 Abnormal results of liver function studies: Secondary | ICD-10-CM | POA: Diagnosis not present

## 2021-03-13 DIAGNOSIS — K219 Gastro-esophageal reflux disease without esophagitis: Secondary | ICD-10-CM | POA: Diagnosis not present

## 2021-03-13 DIAGNOSIS — Z0001 Encounter for general adult medical examination with abnormal findings: Secondary | ICD-10-CM | POA: Diagnosis not present

## 2021-03-14 ENCOUNTER — Other Ambulatory Visit (HOSPITAL_COMMUNITY): Payer: Self-pay | Admitting: Internal Medicine

## 2021-03-14 ENCOUNTER — Other Ambulatory Visit: Payer: Self-pay | Admitting: Internal Medicine

## 2021-03-14 DIAGNOSIS — R1032 Left lower quadrant pain: Secondary | ICD-10-CM

## 2021-03-21 ENCOUNTER — Other Ambulatory Visit: Payer: Self-pay

## 2021-03-21 ENCOUNTER — Ambulatory Visit (HOSPITAL_COMMUNITY)
Admission: RE | Admit: 2021-03-21 | Discharge: 2021-03-21 | Disposition: A | Discharge status: Home / Self Care | Payer: Medicare PPO | Source: Ambulatory Visit | Attending: Internal Medicine | Admitting: Internal Medicine

## 2021-03-21 DIAGNOSIS — R1032 Left lower quadrant pain: Secondary | ICD-10-CM | POA: Insufficient documentation

## 2021-03-21 DIAGNOSIS — S22080A Wedge compression fracture of T11-T12 vertebra, initial encounter for closed fracture: Secondary | ICD-10-CM | POA: Diagnosis not present

## 2021-03-21 DIAGNOSIS — N200 Calculus of kidney: Secondary | ICD-10-CM | POA: Diagnosis not present

## 2021-03-21 DIAGNOSIS — K5732 Diverticulitis of large intestine without perforation or abscess without bleeding: Secondary | ICD-10-CM | POA: Diagnosis not present

## 2021-03-21 DIAGNOSIS — N281 Cyst of kidney, acquired: Secondary | ICD-10-CM | POA: Diagnosis not present

## 2021-04-02 DIAGNOSIS — N390 Urinary tract infection, site not specified: Secondary | ICD-10-CM | POA: Diagnosis not present

## 2021-04-02 DIAGNOSIS — K5792 Diverticulitis of intestine, part unspecified, without perforation or abscess without bleeding: Secondary | ICD-10-CM | POA: Diagnosis not present

## 2021-05-29 DIAGNOSIS — R059 Cough, unspecified: Secondary | ICD-10-CM | POA: Diagnosis not present

## 2021-05-29 DIAGNOSIS — Z20822 Contact with and (suspected) exposure to covid-19: Secondary | ICD-10-CM | POA: Diagnosis not present

## 2021-05-29 DIAGNOSIS — J029 Acute pharyngitis, unspecified: Secondary | ICD-10-CM | POA: Diagnosis not present

## 2021-05-29 DIAGNOSIS — R519 Headache, unspecified: Secondary | ICD-10-CM | POA: Diagnosis not present

## 2021-06-03 DIAGNOSIS — R059 Cough, unspecified: Secondary | ICD-10-CM | POA: Diagnosis not present

## 2021-06-25 DIAGNOSIS — R5383 Other fatigue: Secondary | ICD-10-CM | POA: Diagnosis not present

## 2021-06-25 DIAGNOSIS — E782 Mixed hyperlipidemia: Secondary | ICD-10-CM | POA: Diagnosis not present

## 2021-06-25 DIAGNOSIS — E1121 Type 2 diabetes mellitus with diabetic nephropathy: Secondary | ICD-10-CM | POA: Diagnosis not present

## 2021-06-25 DIAGNOSIS — R350 Frequency of micturition: Secondary | ICD-10-CM | POA: Diagnosis not present

## 2021-07-01 DIAGNOSIS — J302 Other seasonal allergic rhinitis: Secondary | ICD-10-CM | POA: Diagnosis not present

## 2021-07-01 DIAGNOSIS — K219 Gastro-esophageal reflux disease without esophagitis: Secondary | ICD-10-CM | POA: Diagnosis not present

## 2021-07-01 DIAGNOSIS — I10 Essential (primary) hypertension: Secondary | ICD-10-CM | POA: Diagnosis not present

## 2021-07-01 DIAGNOSIS — R809 Proteinuria, unspecified: Secondary | ICD-10-CM | POA: Diagnosis not present

## 2021-07-01 DIAGNOSIS — M545 Low back pain, unspecified: Secondary | ICD-10-CM | POA: Diagnosis not present

## 2021-07-01 DIAGNOSIS — N182 Chronic kidney disease, stage 2 (mild): Secondary | ICD-10-CM | POA: Diagnosis not present

## 2021-07-01 DIAGNOSIS — R945 Abnormal results of liver function studies: Secondary | ICD-10-CM | POA: Diagnosis not present

## 2021-07-01 DIAGNOSIS — E1121 Type 2 diabetes mellitus with diabetic nephropathy: Secondary | ICD-10-CM | POA: Diagnosis not present

## 2021-07-01 DIAGNOSIS — E782 Mixed hyperlipidemia: Secondary | ICD-10-CM | POA: Diagnosis not present

## 2021-08-10 IMAGING — MR MR HEAD WO/W CM
13 of 16 series · 25 of 48 positions shown · IV contrast (7 ml Gadavist)
Comparison: Intracranial MRA the same day reported separately.
Brain MRI 11/05/2016, 12/10/2017

CLINICAL DATA: 74-year-old female with history of concussion,
memory loss, breast cancer. Altered mental status, TIA.

EXAM:
MRI HEAD WITHOUT AND WITH CONTRAST
TECHNIQUE: Multiplanar, multiecho pulse sequences of the brain and surrounding
structures were obtained without and with intravenous contrast.
CONTRAST:  7mL GADAVIST GADOBUTROL 1 MMOL/ML IV SOLN

[Series 5: DWI · axial · 4.0mm · 0.88mm/px · z∈[-85,+55]mm · 2 of 36 slices shown (1 of 6)]
[im 1/36]
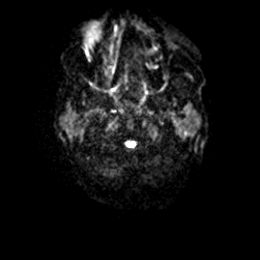
[im 36/36]
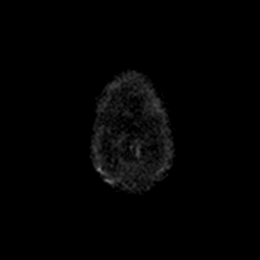

[Series 5: DWI · axial · 4.0mm · 0.88mm/px · z∈[-81,+55]mm · 2 of 35 slices shown (2 of 6)]
[im 1/35]
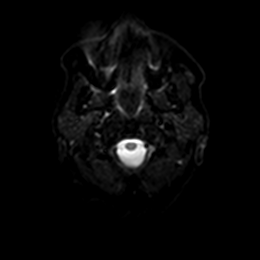
[im 35/35]
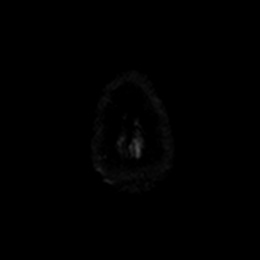

[Series 6: DWI · axial · 4.0mm · 0.88mm/px · z∈[-85,+55]mm · 2 of 36 slices shown (3 of 6)]
[im 1/36]
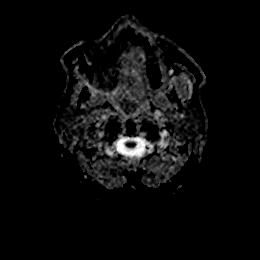
[im 36/36]
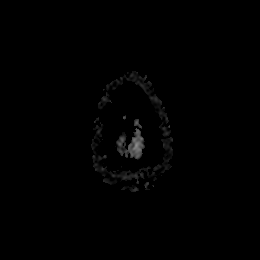

[Series 7: DWI · coronal · 5.0mm · 0.88mm/px · 2 of 28 slices shown (4 of 6)]
[im 1/28]
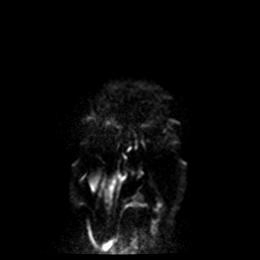
[im 28/28]
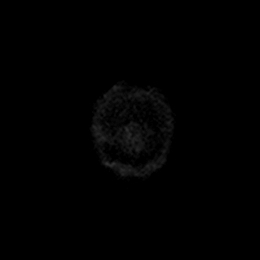

[Series 7: DWI · coronal · 5.0mm · 0.88mm/px · 2 of 28 slices shown (5 of 6)]
[im 1/28]
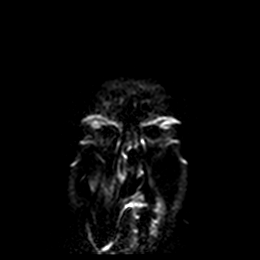
[im 28/28]
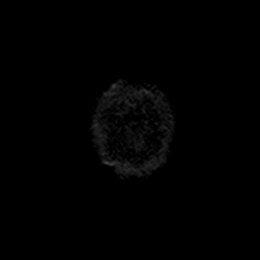

[Series 8: DWI · coronal · 5.0mm · 0.88mm/px · 2 of 28 slices shown (6 of 6)]
[im 1/28]
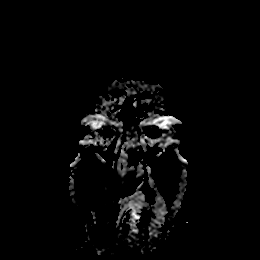
[im 28/28]
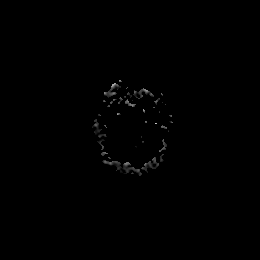

[Series 9: T1 · sagittal · 5.0mm · 0.94mm/px · 2 of 25 slices shown (1 of 2)]
[im 1/25]
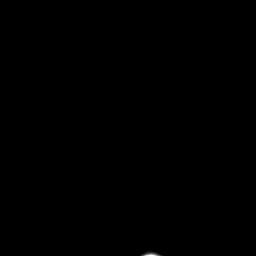
[im 25/25]
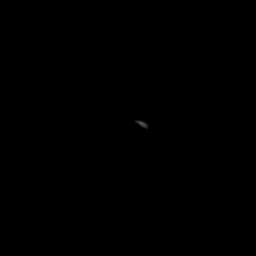

[Series 15: T2 · axial · 5.0mm · 0.72mm/px · 1 of 20 slices shown (1 of 2)]
[im 1/20]
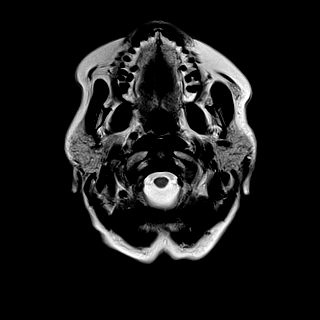

[Series 16: ax hemo · axial · 5.0mm · 0.86mm/px · z∈[-86,+58]mm · 2 of 25 slices shown]
[im 1/25]
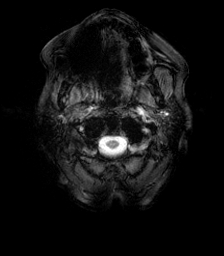
[im 25/25]
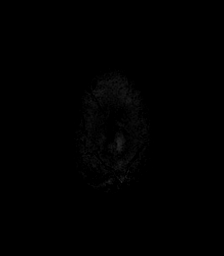

[Series 17: FLAIR · axial · 4.0mm · 0.43mm/px · z∈[-76,+48]mm · 2 of 32 slices shown]
[im 1/32]
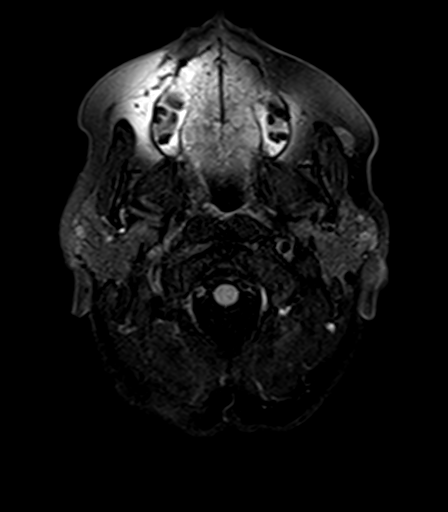
[im 32/32]
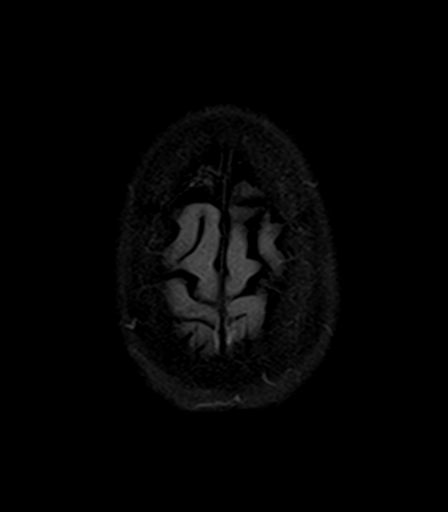

[Series 19: T2 · coronal · 5.0mm · 0.72mm/px · 2 of 28 slices shown (2 of 2)]
[im 1/28]
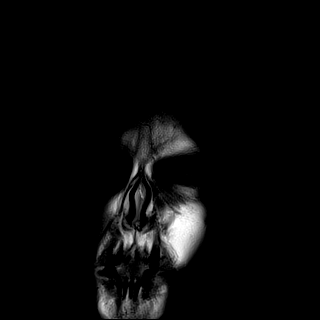
[im 28/28]
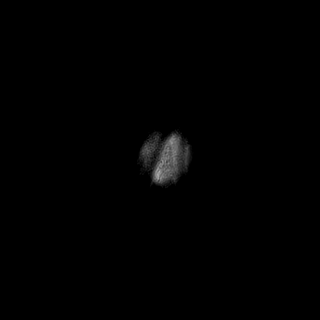

[Series 21: T1 post-contrast · coronal · 5.0mm · 0.34mm/px · 2 of 29 slices shown]
[im 1/29]
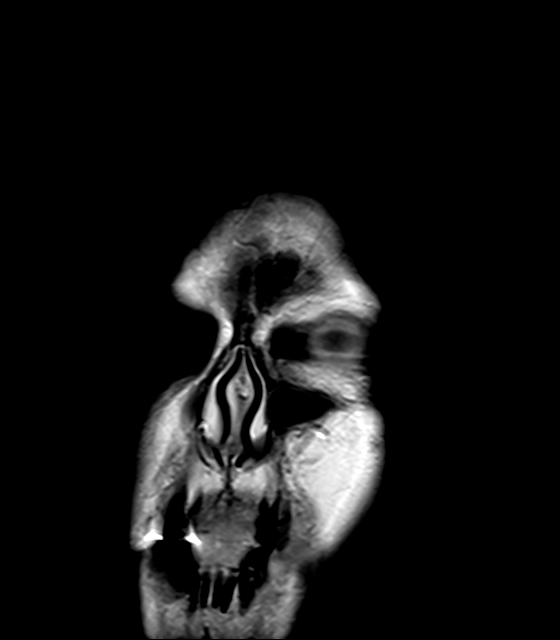
[im 29/29]
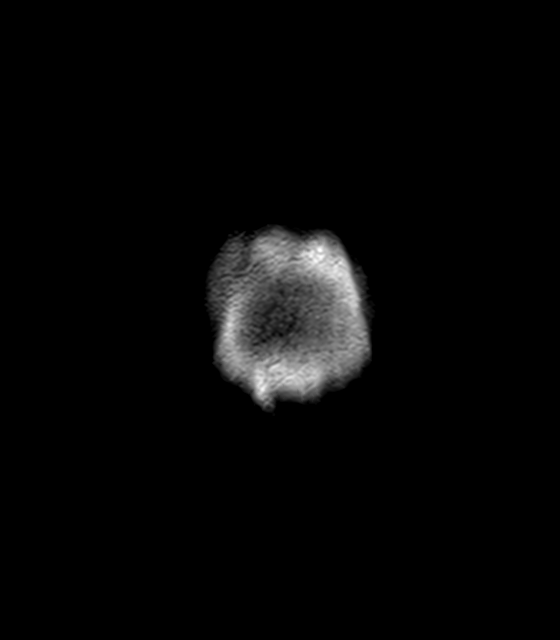

[Series 22: T1 · sagittal · 5.0mm · 0.94mm/px · 2 of 25 slices shown (2 of 2)]
[im 1/25]
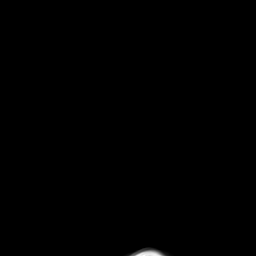
[im 25/25]
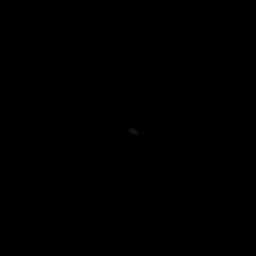

[25 of 48 positions shown; findings below may reference images not displayed]

FINDINGS: Brain: No restricted diffusion to suggest acute infarction. No
midline shift, mass effect, evidence of mass lesion,
ventriculomegaly, extra-axial collection or acute intracranial
hemorrhage. Cervicomedullary junction and pituitary are within
normal limits.

Cerebral volume is stable since 6602. Patchy bilateral cerebral
white matter T2 and FLAIR hyperintensity is stable, in a nonspecific
configuration and more pronounced in the right hemisphere as before.
No cortical encephalomalacia. No chronic cerebral blood products on
T2* imaging, and SWI imaging likewise appear negative in 5599. Mild
T2 heterogeneity in the bilateral basal ganglia is stable and
appears in part related to perivascular spaces. Thalami, brainstem
and cerebellum are stable and within normal limits. On conventional
coronal images no mesial temporal lobe abnormality is identified.

No abnormal enhancement identified. No dural thickening.

Vascular: Major intracranial vascular flow voids are stable since
6602. The major dural venous sinuses are enhancing and appear to be
patent.

Skull and upper cervical spine: Normal for age visible cervical
spine, bone marrow signal.

Sinuses/Orbits: Stable and negative.

Other: Mastoids are clear today. Visible internal auditory
structures appear normal. Negative visible scalp and face.
IMPRESSION: 1. Stable MRI appearance of the brain with mild to moderate for age
signal changes in the cerebral white matter and basal ganglia most
commonly due to small vessel disease.
2. No metastatic disease or acute intracranial abnormality.

## 2021-10-18 ENCOUNTER — Other Ambulatory Visit: Payer: Self-pay

## 2021-10-18 ENCOUNTER — Emergency Department (HOSPITAL_COMMUNITY)
Admission: EM | Admit: 2021-10-18 | Discharge: 2021-10-18 | Disposition: A | Discharge status: Home / Self Care | Payer: Medicare PPO | Attending: Emergency Medicine | Admitting: Emergency Medicine

## 2021-10-18 ENCOUNTER — Emergency Department (HOSPITAL_COMMUNITY): Payer: Medicare PPO

## 2021-10-18 ENCOUNTER — Encounter (HOSPITAL_COMMUNITY): Payer: Self-pay | Admitting: *Deleted

## 2021-10-18 DIAGNOSIS — E119 Type 2 diabetes mellitus without complications: Secondary | ICD-10-CM | POA: Diagnosis not present

## 2021-10-18 DIAGNOSIS — R748 Abnormal levels of other serum enzymes: Secondary | ICD-10-CM | POA: Diagnosis not present

## 2021-10-18 DIAGNOSIS — Z79899 Other long term (current) drug therapy: Secondary | ICD-10-CM | POA: Insufficient documentation

## 2021-10-18 DIAGNOSIS — R109 Unspecified abdominal pain: Secondary | ICD-10-CM | POA: Diagnosis not present

## 2021-10-18 DIAGNOSIS — Z7982 Long term (current) use of aspirin: Secondary | ICD-10-CM | POA: Insufficient documentation

## 2021-10-18 DIAGNOSIS — R1010 Upper abdominal pain, unspecified: Secondary | ICD-10-CM | POA: Insufficient documentation

## 2021-10-18 DIAGNOSIS — I700 Atherosclerosis of aorta: Secondary | ICD-10-CM | POA: Diagnosis not present

## 2021-10-18 DIAGNOSIS — R1084 Generalized abdominal pain: Secondary | ICD-10-CM | POA: Diagnosis present

## 2021-10-18 DIAGNOSIS — D72829 Elevated white blood cell count, unspecified: Secondary | ICD-10-CM | POA: Insufficient documentation

## 2021-10-18 DIAGNOSIS — I10 Essential (primary) hypertension: Secondary | ICD-10-CM | POA: Diagnosis not present

## 2021-10-18 LAB — CBC WITH DIFFERENTIAL/PLATELET
Abs Immature Granulocytes: 0.05 10*3/uL (ref 0.00–0.07)
Basophils Absolute: 0.1 10*3/uL (ref 0.0–0.1)
Basophils Relative: 1 %
Eosinophils Absolute: 0.2 10*3/uL (ref 0.0–0.5)
Eosinophils Relative: 2 %
HCT: 42.8 % (ref 36.0–46.0)
Hemoglobin: 14.6 g/dL (ref 12.0–15.0)
Immature Granulocytes: 1 %
Lymphocytes Relative: 12 %
Lymphs Abs: 1.3 10*3/uL (ref 0.7–4.0)
MCH: 29.2 pg (ref 26.0–34.0)
MCHC: 34.1 g/dL (ref 30.0–36.0)
MCV: 85.6 fL (ref 80.0–100.0)
Monocytes Absolute: 0.6 10*3/uL (ref 0.1–1.0)
Monocytes Relative: 5 %
Neutro Abs: 8.8 10*3/uL — ABNORMAL HIGH (ref 1.7–7.7)
Neutrophils Relative %: 79 %
Platelets: 197 10*3/uL (ref 150–400)
RBC: 5 MIL/uL (ref 3.87–5.11)
RDW: 14.3 % (ref 11.5–15.5)
WBC: 11.1 10*3/uL — ABNORMAL HIGH (ref 4.0–10.5)
nRBC: 0 % (ref 0.0–0.2)

## 2021-10-18 LAB — COMPREHENSIVE METABOLIC PANEL
ALT: 46 U/L — ABNORMAL HIGH (ref 0–44)
AST: 53 U/L — ABNORMAL HIGH (ref 15–41)
Albumin: 4.2 g/dL (ref 3.5–5.0)
Alkaline Phosphatase: 80 U/L (ref 38–126)
Anion gap: 4 — ABNORMAL LOW (ref 5–15)
BUN: 15 mg/dL (ref 8–23)
CO2: 24 mmol/L (ref 22–32)
Calcium: 9.8 mg/dL (ref 8.9–10.3)
Chloride: 111 mmol/L (ref 98–111)
Creatinine, Ser: 0.86 mg/dL (ref 0.44–1.00)
GFR, Estimated: >60 mL/min (ref >60)
Glucose, Bld: 102 mg/dL — ABNORMAL HIGH (ref 70–99)
Potassium: 3.7 mmol/L (ref 3.5–5.1)
Sodium: 139 mmol/L (ref 135–145)
Total Bilirubin: 0.4 mg/dL (ref 0.3–1.2)
Total Protein: 7.3 g/dL (ref 6.5–8.1)

## 2021-10-18 LAB — LIPASE, BLOOD: Lipase: 262 U/L — ABNORMAL HIGH (ref 11–51)

## 2021-10-18 MED ORDER — PANTOPRAZOLE SODIUM 40 MG PO TBEC
40.0000 mg | DELAYED_RELEASE_TABLET | Freq: Once | ORAL | Status: AC
  Administered 2021-10-18: 40 mg via ORAL
  Filled 2021-10-18: qty 1

## 2021-10-18 MED ORDER — PANTOPRAZOLE SODIUM 40 MG PO TBEC: 40.0000 mg | DELAYED_RELEASE_TABLET | Freq: Every day | ORAL | 0 refills | Status: DC

## 2021-10-18 MED ORDER — HYOSCYAMINE SULFATE SL 0.125 MG SL SUBL: 0.1250 mg | SUBLINGUAL_TABLET | Freq: Four times a day (QID) | SUBLINGUAL | 0 refills | Status: DC | PRN

## 2021-10-18 MED ORDER — SODIUM CHLORIDE 0.9 % IV BOLUS
500.0000 mL | Freq: Once | INTRAVENOUS | Status: AC
  Administered 2021-10-18: 500 mL via INTRAVENOUS

## 2021-10-18 NOTE — ED Provider Notes (Signed)
[facility] EMERGENCY DEPARTMENT Provider Note   CSN: [id number] Arrival date & time: 10/18/21  1608     History {Add pertinent medical, surgical, social history, OB history to HPI:1} Chief Complaint  Patient presents with   Abdominal Pain    Diana Hall is a 76 y.o. female.  Patient states that she has had 2 episodes of abdominal cramping each lasting about 30 minutes today.  Patient has never had this before.  She has history of hypertension diabetes and elevated lipids.  The history is provided by the patient and medical records.  Abdominal Pain Pain location:  Generalized Pain quality: aching       Home Medications Prior to Admission medications   Medication Sig Start Date End Date Taking? Authorizing Provider  Hyoscyamine Sulfate SL (LEVSIN/SL) 0.125 MG SUBL Place 0.125 mg under the tongue every 6 (six) hours as needed. 10/18/21  Yes Milton Ferguson, MD  pantoprazole (PROTONIX) 40 MG tablet Take 1 tablet (40 mg total) by mouth daily. 10/18/21  Yes Milton Ferguson, MD  amLODipine (NORVASC) 2.5 MG tablet Take 2.5 mg by mouth daily.    [provider]  aspirin EC 81 MG tablet Take 1 tablet (81 mg total) by mouth daily. Swallow whole. 09/11/20   Melvenia Beam, MD  atorvastatin (LIPITOR) 40 MG tablet Take 40 mg by mouth at bedtime.    [provider]  DULoxetine (CYMBALTA) 60 MG capsule Take 60 mg by mouth 2 (two) times daily.     [provider]  fish oil-omega-3 fatty acids 1000 MG capsule Take 2 g by mouth 2 (two) times daily.     [provider]  loperamide (IMODIUM) 2 MG capsule Take by mouth 3 (three) times daily as needed for diarrhea or loose stools.    [provider]  losartan (COZAAR) 100 MG tablet Take 100 mg by mouth daily.    [provider]  MELOXICAM PO Take by mouth daily.    [provider]  Multiple Vitamin (MULTIVITAMIN) capsule Take 1 capsule by mouth daily.    [provider]   Pancrelipase, Lip-Prot-Amyl, (CREON PO) Take 36,000 Units by mouth. Take 2 before each meal and 1 before snacks (9 tablets per day) for pancreatic insufficiency.    [provider]  predniSONE (DELTASONE) 50 MG tablet Take 1 pill 13 hours, 7 hours  and 1 hour before MRI for a total of 3 doses of prednisone. Take '50mg'$  of Benadryl with the prednisone 1 hour before MRI for a total of one dose of benadryl. 09/11/20   Melvenia Beam, MD  sitaGLIPtin (JANUVIA) 50 MG tablet Take 50 mg by mouth daily.    [provider]      Allergies    Iodinated contrast media, Penicillins, Calcium citrate, Hydrochlorothiazide, and Hydrocodone-acetaminophen    Review of Systems   Review of Systems  Gastrointestinal:  Positive for abdominal pain.   Physical Exam Updated Vital Signs BP (!) 146/86   Pulse 77   Temp 97.7 F (36.5 C) (Oral)   Resp (!) 23   Ht '5\' 4"'$  (1.626 m)   Wt 65.8 kg   SpO2 100%   BMI 24.89 kg/m  Physical Exam  ED Results / Procedures / Treatments   Labs (all labs ordered are listed, but only abnormal results are displayed) Labs Reviewed  CBC WITH DIFFERENTIAL/PLATELET - Abnormal; Notable for the following components:      Result Value   WBC 11.1 (*)  Neutro Abs 8.8 (*)    All other components within normal limits  COMPREHENSIVE METABOLIC PANEL - Abnormal; Notable for the following components:   Glucose, Bld 102 (*)    AST 53 (*)    ALT 46 (*)    Anion gap 4 (*)    All other components within normal limits  LIPASE, BLOOD - Abnormal; Notable for the following components:   Lipase 262 (*)    All other components within normal limits  URINALYSIS, ROUTINE W REFLEX MICROSCOPIC    EKG None  Radiology CT ABDOMEN PELVIS WO CONTRAST  Result Date: 10/18/2021 CLINICAL DATA:  Upper abdominal pain, nausea, diarrhea EXAM: CT ABDOMEN AND PELVIS WITHOUT CONTRAST TECHNIQUE: Multidetector CT imaging of the abdomen and pelvis was performed following the standard  protocol without IV contrast. RADIATION DOSE REDUCTION: This exam was performed according to the departmental dose-optimization program which includes automated exposure control, adjustment of the mA and/or kV according to patient size and/or use of iterative reconstruction technique. COMPARISON:  03/21/2021 FINDINGS: Lower chest: No acute pleural or parenchymal lung disease. Moderate hiatal hernia. Hepatobiliary: Cholecystectomy. Stable postsurgical dilatation of the biliary tree. No evidence of choledocholithiasis. Unremarkable unenhanced appearance of the liver. Pancreas: Pancreatic divisum incidentally noted. Otherwise unremarkable unenhanced appearance of the pancreas. Spleen: Unremarkable unenhanced appearance. Adrenals/Urinary Tract: There are punctate bilateral nonobstructing renal calculi, measuring 1 mm on the right and 3 mm on the left. No obstructive uropathy within either kidney. Stable simple left renal cyst does not require imaging follow-up. Bladder is only minimally distended, without gross abnormality. Adrenals are unremarkable. Stomach/Bowel: No bowel obstruction or ileus. Normal appendix right lower quadrant. Colonic diverticulosis without diverticulitis. No bowel wall thickening or inflammatory change. Large duodenal diverticula are again noted, without inflammatory change. Hiatal hernia as above. Vascular/Lymphatic: Aortic atherosclerosis. No enlarged abdominal or pelvic lymph nodes. Reproductive: Status post hysterectomy. No adnexal masses. Other: No free fluid or free intraperitoneal gas. No abdominal wall hernia. Musculoskeletal: No acute or destructive bony lesions. Chronic T12 compression deformity. Reconstructed images demonstrate no additional findings. IMPRESSION: 1. Punctate bilateral nonobstructing renal calculi, unchanged. 2. Colonic diverticulosis without evidence of acute diverticulitis. 3. Stable hiatal hernia. 4.  Aortic Atherosclerosis (ICD10-I70.0). Electronically Signed   By:  Randa Ngo M.D.   On: 10/18/2021 17:22    Procedures Procedures  {Document cardiac monitor, telemetry assessment procedure when appropriate:1}  Medications Ordered in ED Medications  pantoprazole (PROTONIX) EC tablet 40 mg (has no administration in time range)  sodium chloride 0.9 % bolus 500 mL (0 mLs Intravenous Stopped 10/18/21 1750)    ED Course/ Medical Decision Making/ A&P Patient had an elevated lipase.  And minimal elevated liver enzymes.  I spoke with Dr. Gala Romney gastroenterologist and he recommended starting the patient on Protonix and Levsin and having her only have a liquid diet today and then advance tomorrow if she is doing well.  She is to follow-up with Dr. Gala Romney or one of his colleagues.                         Medical Decision Making Amount and/or Complexity of Data Reviewed Labs: ordered. Radiology: ordered.  Risk Prescription drug management.   Abdominal pain that has resolved with elevated lipase I spoke with GI and we will discharge her home with Protonix and Levsin and she will follow-up with GI  {Document critical care time when appropriate:1} {Document review of labs and clinical decision tools ie heart score, Chads2Vasc2 etc:1}  {  Document your independent review of radiology images, and any outside records:1} {Document your discussion with family members, caretakers, and with consultants:1} {Document social determinants of health affecting pt's care:1} {Document your decision making why or why not admission, treatments were needed:1} Final Clinical Impression(s) / ED Diagnoses Final diagnoses:  Pain of upper abdomen    Rx / DC Orders ED Discharge Orders          Ordered    pantoprazole (PROTONIX) 40 MG tablet  Daily        10/18/21 1814    Hyoscyamine Sulfate SL (LEVSIN/SL) 0.125 MG SUBL  Every 6 hours PRN        10/18/21 1814

## 2021-10-18 NOTE — Discharge Instructions (Signed)
Dr. Roseanne Kaufman group will contact you next week for follow-up.  If you do not hear from them next week he should call them.  Return if any problems.  Take liquids only today and then you can eat normal foods tomorrow if you are doing well

## 2021-10-18 NOTE — ED Triage Notes (Signed)
Pt with upper abd pain with nausea and diarrhea, no emesis.

## 2021-10-21 ENCOUNTER — Encounter: Payer: Self-pay | Admitting: Internal Medicine

## 2021-10-28 DIAGNOSIS — E1121 Type 2 diabetes mellitus with diabetic nephropathy: Secondary | ICD-10-CM | POA: Diagnosis not present

## 2021-10-28 DIAGNOSIS — E782 Mixed hyperlipidemia: Secondary | ICD-10-CM | POA: Diagnosis not present

## 2021-10-29 DIAGNOSIS — E1121 Type 2 diabetes mellitus with diabetic nephropathy: Secondary | ICD-10-CM | POA: Diagnosis not present

## 2021-11-13 ENCOUNTER — Ambulatory Visit: Payer: Medicare PPO | Admitting: Gastroenterology

## 2021-12-03 DIAGNOSIS — K219 Gastro-esophageal reflux disease without esophagitis: Secondary | ICD-10-CM | POA: Diagnosis not present

## 2021-12-03 DIAGNOSIS — E1121 Type 2 diabetes mellitus with diabetic nephropathy: Secondary | ICD-10-CM | POA: Diagnosis not present

## 2021-12-03 DIAGNOSIS — R809 Proteinuria, unspecified: Secondary | ICD-10-CM | POA: Diagnosis not present

## 2021-12-03 DIAGNOSIS — J302 Other seasonal allergic rhinitis: Secondary | ICD-10-CM | POA: Diagnosis not present

## 2021-12-03 DIAGNOSIS — R945 Abnormal results of liver function studies: Secondary | ICD-10-CM | POA: Diagnosis not present

## 2021-12-03 DIAGNOSIS — E782 Mixed hyperlipidemia: Secondary | ICD-10-CM | POA: Diagnosis not present

## 2021-12-03 DIAGNOSIS — I10 Essential (primary) hypertension: Secondary | ICD-10-CM | POA: Diagnosis not present

## 2021-12-03 DIAGNOSIS — M545 Low back pain, unspecified: Secondary | ICD-10-CM | POA: Diagnosis not present

## 2021-12-03 DIAGNOSIS — N182 Chronic kidney disease, stage 2 (mild): Secondary | ICD-10-CM | POA: Diagnosis not present

## 2022-01-27 ENCOUNTER — Other Ambulatory Visit: Payer: Self-pay | Admitting: Internal Medicine

## 2022-01-27 DIAGNOSIS — Z1231 Encounter for screening mammogram for malignant neoplasm of breast: Secondary | ICD-10-CM

## 2022-01-28 IMAGING — CT CT ABD-PELV W/O CM
2 of 4 series · 16 of 46 positions shown, 18 images · non-contrast
Comparison: None

CLINICAL DATA: Left lower quadrant abdominal pain for 1 month.

EXAM:
CT ABDOMEN AND PELVIS WITHOUT CONTRAST
TECHNIQUE: Multidetector CT imaging of the abdomen and pelvis was performed
following the standard protocol without IV contrast.

[Series 2: axial st · axial · 0.71mm/px · z∈[+895,+1300]mm · 13 of 93 slices shown, 15 images]
[im 6/93  soft-tissue]
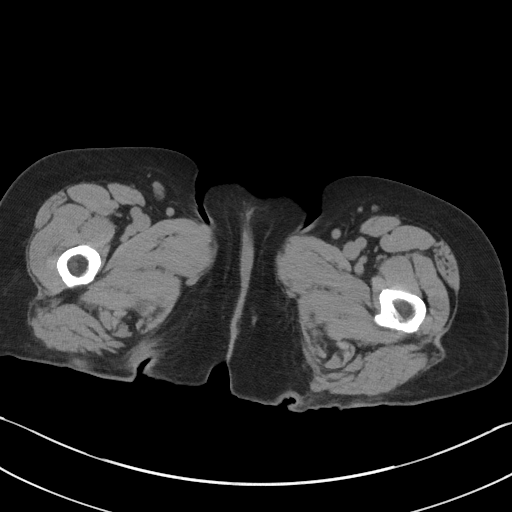
[im 6/93  bone]
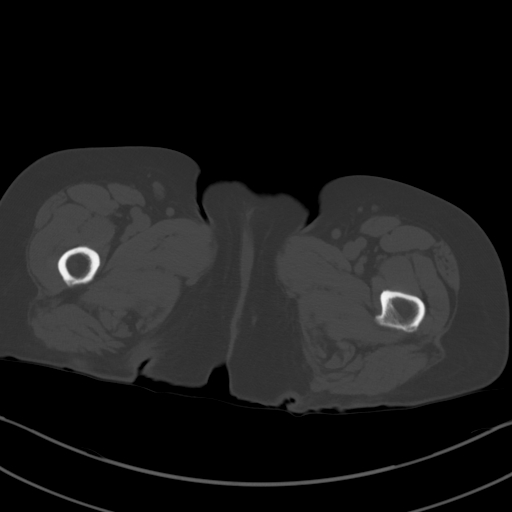
[im 11/93  soft-tissue]
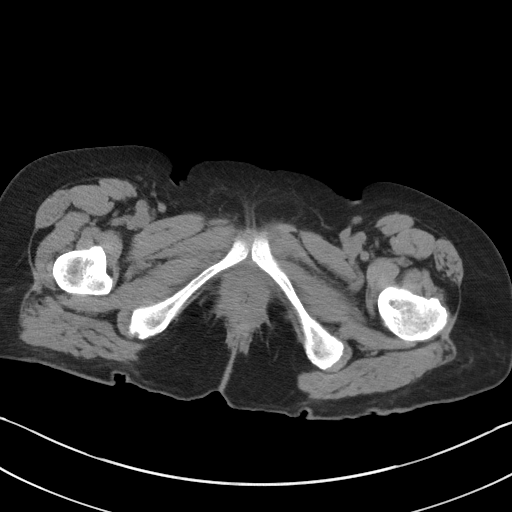
[im 22/93  soft-tissue]
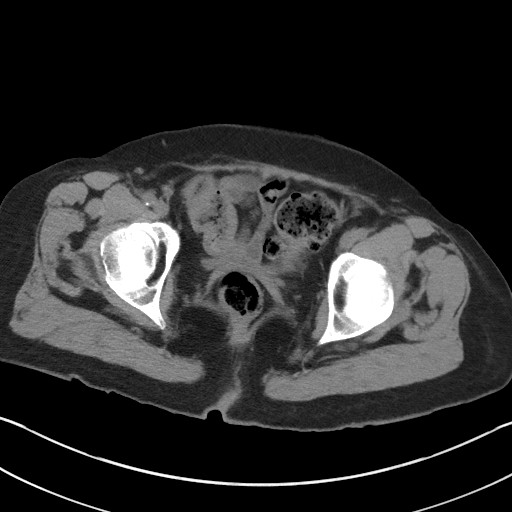
[im 28/93  soft-tissue]
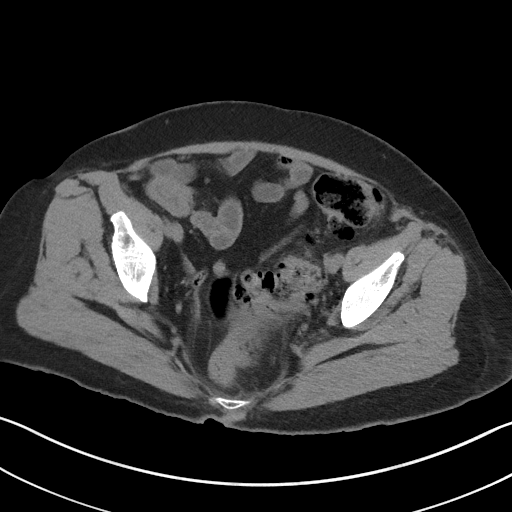
[im 33/93  soft-tissue]
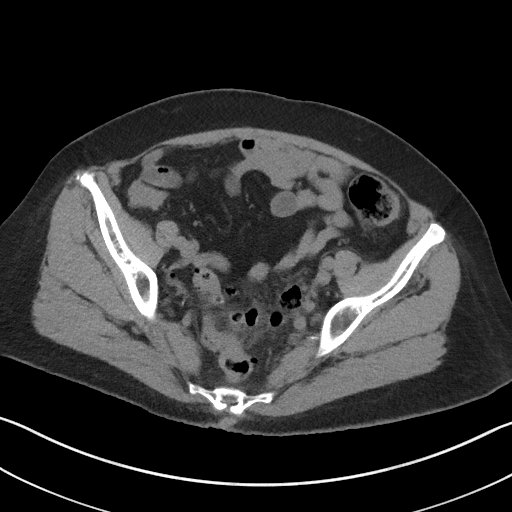
[im 38/93  soft-tissue]
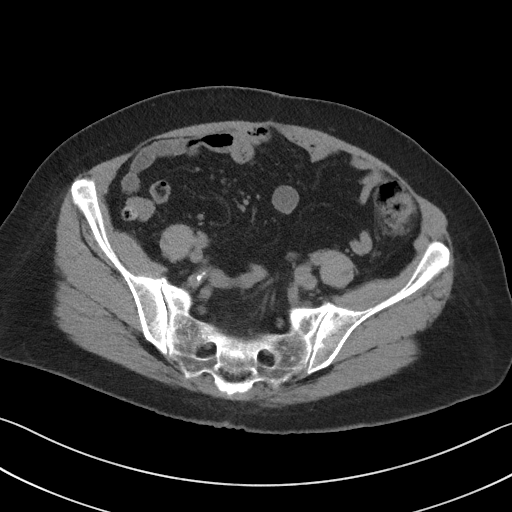
[im 49/93  soft-tissue]
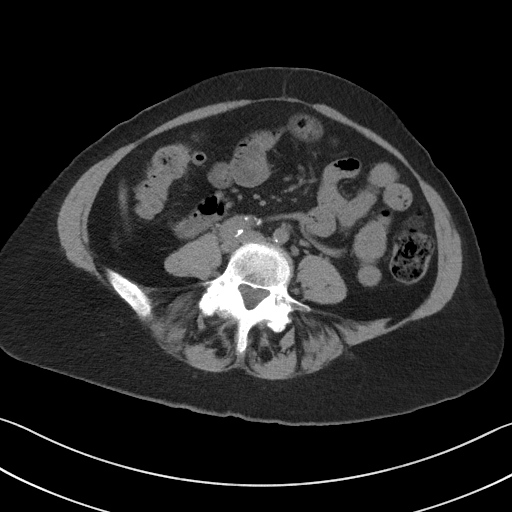
[im 55/93  soft-tissue]
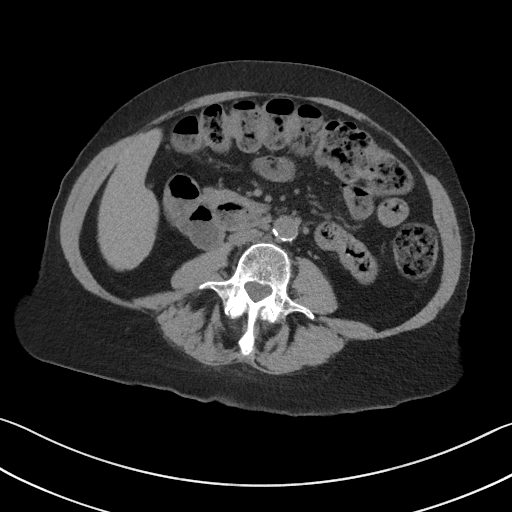
[im 60/93  soft-tissue]
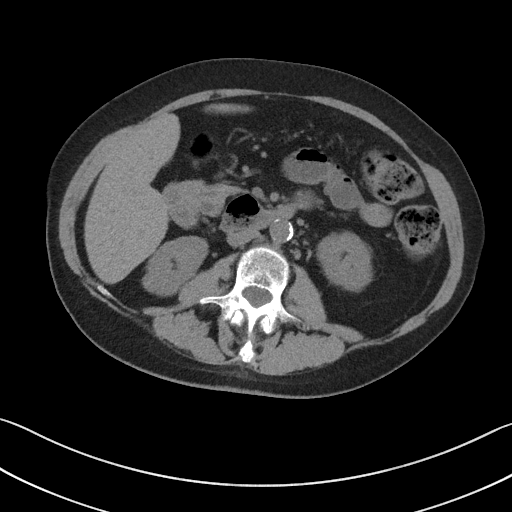
[im 60/93  bone]
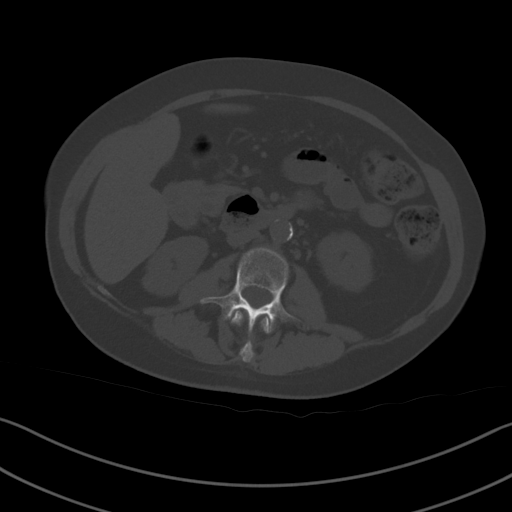
[im 65/93  soft-tissue]
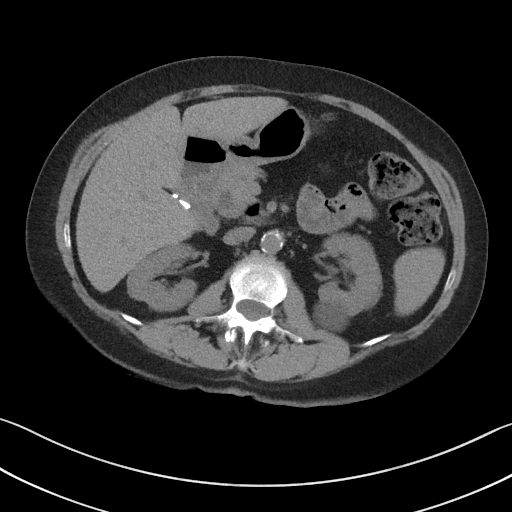
[im 71/93  soft-tissue]
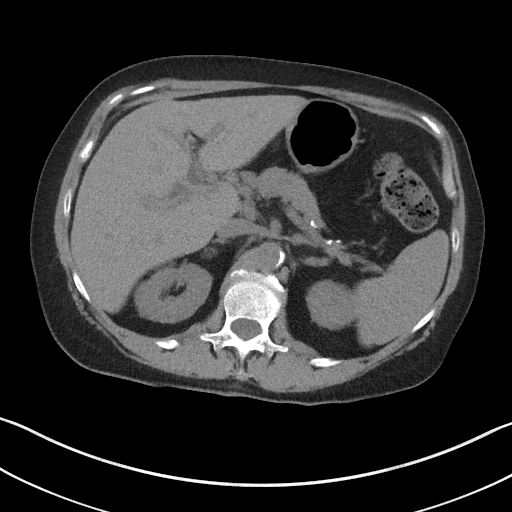
[im 82/93  soft-tissue]
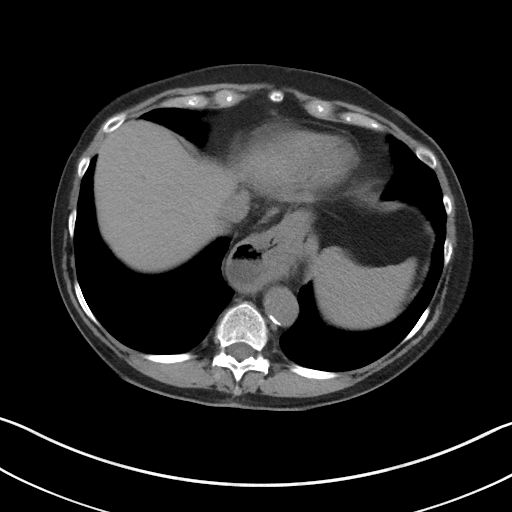
[im 87/93  soft-tissue]
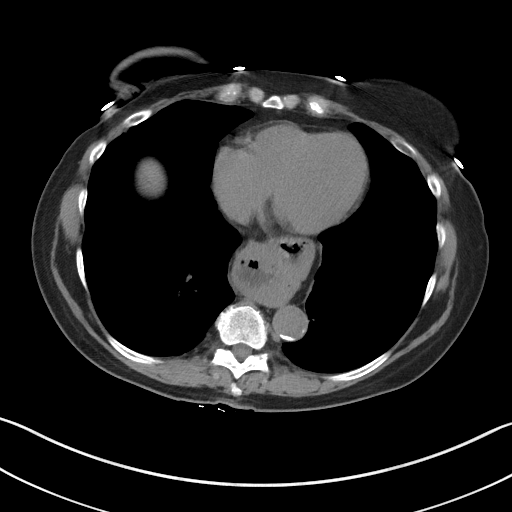

[Series 5: coronal st · coronal · 0.79mm/px · 3 of 95 slices shown]
[im 32/95  soft-tissue]
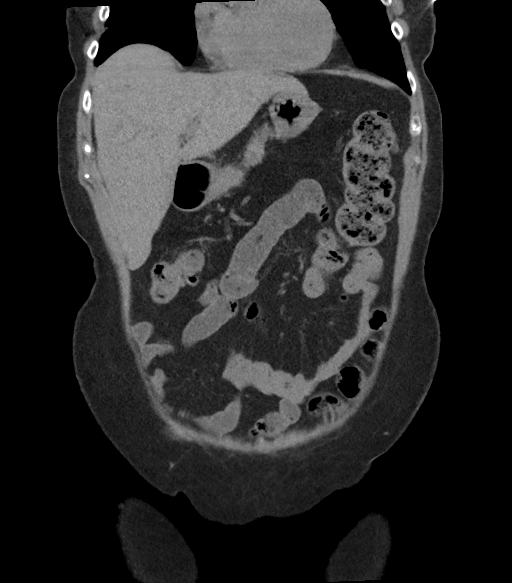
[im 42/95  soft-tissue]
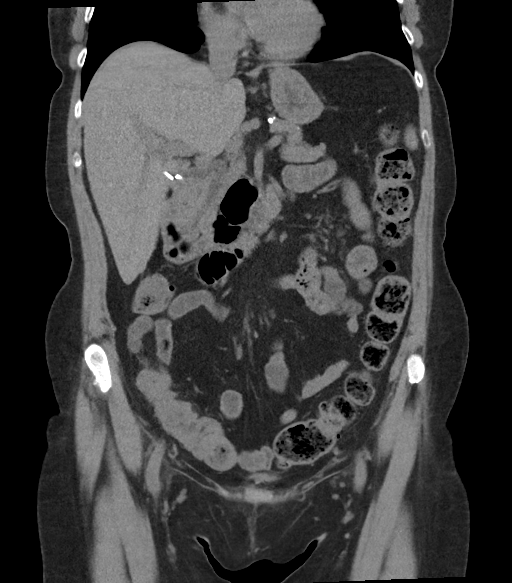
[im 53/95  soft-tissue]
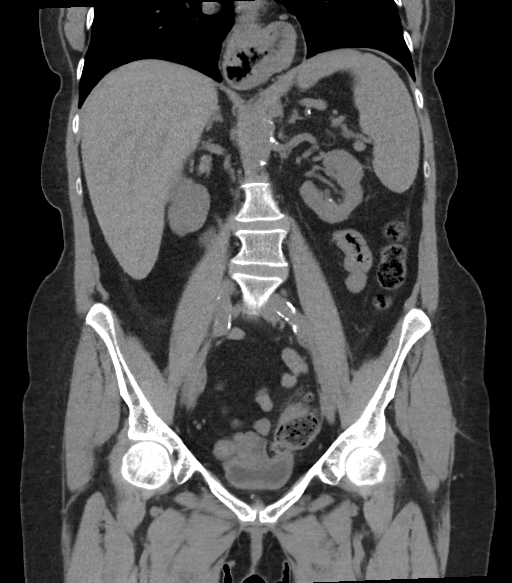

[16 of 46 positions shown; findings below may reference images not displayed]

FINDINGS: Lower chest: No acute abnormality.

Hepatobiliary: No focal liver abnormality is seen. Status post
cholecystectomy. No biliary dilatation.

Pancreas: Unremarkable.

Spleen: Unremarkable.

Adrenals/Urinary Tract: Adrenals are unremarkable. 2 mm
nonobstructing calculus at the lower pole of the left kidney.
Possible punctate nonobstructing right interpolar calculus. Small
left renal cysts. Bladder is not well evaluated due to poor
distension.

Stomach/Bowel: Moderate hiatal hernia. Bowel is normal in caliber.
There is colonic diverticulosis. Fat infiltration is noted adjacent
to the sigmoid colon where there are many diverticula. Focal
exophytic soft tissue thickening at the posterior aspect of the
sigmoid could reflect the inflamed diverticulum (series 2, image
64). Normal appendix.

Vascular/Lymphatic: Aortic atherosclerosis. No enlarged lymph nodes.

Reproductive: Status post hysterectomy. No adnexal masses.

Other: No free fluid.  No acute abnormality of the abdominal wall.

Musculoskeletal: Chronic T12 compression fracture. Lumbar spine
degenerative changes.
IMPRESSION: Acute sigmoid diverticulitis. Focal exophytic soft tissue thickening
at the posterior aspect could reflect inflamed diverticulum. No
definite abscess on this noncontrast study. Colonoscopy is
recommended after treatment exclude lesion.

Small nonobstructing renal calculi.

Aortic atherosclerosis.

## 2022-01-29 DIAGNOSIS — C50911 Malignant neoplasm of unspecified site of right female breast: Secondary | ICD-10-CM | POA: Diagnosis not present

## 2022-03-04 DIAGNOSIS — C50911 Malignant neoplasm of unspecified site of right female breast: Secondary | ICD-10-CM | POA: Diagnosis not present

## 2022-03-06 ENCOUNTER — Ambulatory Visit: Payer: Medicare PPO

## 2022-03-09 DIAGNOSIS — E782 Mixed hyperlipidemia: Secondary | ICD-10-CM | POA: Diagnosis not present

## 2022-03-09 DIAGNOSIS — E1121 Type 2 diabetes mellitus with diabetic nephropathy: Secondary | ICD-10-CM | POA: Diagnosis not present

## 2022-03-27 DIAGNOSIS — M545 Low back pain, unspecified: Secondary | ICD-10-CM | POA: Diagnosis not present

## 2022-03-27 DIAGNOSIS — E118 Type 2 diabetes mellitus with unspecified complications: Secondary | ICD-10-CM | POA: Diagnosis not present

## 2022-03-27 DIAGNOSIS — I10 Essential (primary) hypertension: Secondary | ICD-10-CM | POA: Diagnosis not present

## 2022-03-27 DIAGNOSIS — R945 Abnormal results of liver function studies: Secondary | ICD-10-CM | POA: Diagnosis not present

## 2022-03-27 DIAGNOSIS — Z0000 Encounter for general adult medical examination without abnormal findings: Secondary | ICD-10-CM | POA: Diagnosis not present

## 2022-03-27 DIAGNOSIS — J302 Other seasonal allergic rhinitis: Secondary | ICD-10-CM | POA: Diagnosis not present

## 2022-03-27 DIAGNOSIS — N182 Chronic kidney disease, stage 2 (mild): Secondary | ICD-10-CM | POA: Diagnosis not present

## 2022-03-27 DIAGNOSIS — K219 Gastro-esophageal reflux disease without esophagitis: Secondary | ICD-10-CM | POA: Diagnosis not present

## 2022-03-27 DIAGNOSIS — E782 Mixed hyperlipidemia: Secondary | ICD-10-CM | POA: Diagnosis not present

## 2022-03-30 ENCOUNTER — Other Ambulatory Visit: Payer: Self-pay | Admitting: Internal Medicine

## 2022-03-30 DIAGNOSIS — Z13820 Encounter for screening for osteoporosis: Secondary | ICD-10-CM

## 2022-04-14 ENCOUNTER — Ambulatory Visit
Admission: RE | Admit: 2022-04-14 | Discharge: 2022-04-14 | Disposition: A | Discharge status: Home / Self Care | Payer: Medicare PPO | Source: Ambulatory Visit | Attending: Internal Medicine | Admitting: Internal Medicine

## 2022-04-14 DIAGNOSIS — Z1231 Encounter for screening mammogram for malignant neoplasm of breast: Secondary | ICD-10-CM

## 2022-04-27 DIAGNOSIS — L293 Anogenital pruritus, unspecified: Secondary | ICD-10-CM | POA: Diagnosis not present

## 2022-07-21 DIAGNOSIS — E782 Mixed hyperlipidemia: Secondary | ICD-10-CM | POA: Diagnosis not present

## 2022-07-21 DIAGNOSIS — E118 Type 2 diabetes mellitus with unspecified complications: Secondary | ICD-10-CM | POA: Diagnosis not present

## 2022-07-29 DIAGNOSIS — E785 Hyperlipidemia, unspecified: Secondary | ICD-10-CM | POA: Diagnosis not present

## 2022-07-29 DIAGNOSIS — E782 Mixed hyperlipidemia: Secondary | ICD-10-CM | POA: Diagnosis not present

## 2022-07-29 DIAGNOSIS — E118 Type 2 diabetes mellitus with unspecified complications: Secondary | ICD-10-CM | POA: Diagnosis not present

## 2022-07-29 DIAGNOSIS — J302 Other seasonal allergic rhinitis: Secondary | ICD-10-CM | POA: Diagnosis not present

## 2022-07-29 DIAGNOSIS — E1122 Type 2 diabetes mellitus with diabetic chronic kidney disease: Secondary | ICD-10-CM | POA: Diagnosis not present

## 2022-07-29 DIAGNOSIS — N182 Chronic kidney disease, stage 2 (mild): Secondary | ICD-10-CM | POA: Diagnosis not present

## 2022-07-29 DIAGNOSIS — I10 Essential (primary) hypertension: Secondary | ICD-10-CM | POA: Diagnosis not present

## 2022-07-29 DIAGNOSIS — I129 Hypertensive chronic kidney disease with stage 1 through stage 4 chronic kidney disease, or unspecified chronic kidney disease: Secondary | ICD-10-CM | POA: Diagnosis not present

## 2022-07-29 DIAGNOSIS — K219 Gastro-esophageal reflux disease without esophagitis: Secondary | ICD-10-CM | POA: Diagnosis not present

## 2022-08-03 DIAGNOSIS — Z7984 Long term (current) use of oral hypoglycemic drugs: Secondary | ICD-10-CM | POA: Diagnosis not present

## 2022-08-03 DIAGNOSIS — E119 Type 2 diabetes mellitus without complications: Secondary | ICD-10-CM | POA: Diagnosis not present

## 2022-08-03 DIAGNOSIS — H26493 Other secondary cataract, bilateral: Secondary | ICD-10-CM | POA: Diagnosis not present

## 2022-08-03 DIAGNOSIS — Z961 Presence of intraocular lens: Secondary | ICD-10-CM | POA: Diagnosis not present

## 2022-09-14 DIAGNOSIS — W010XXA Fall on same level from slipping, tripping and stumbling without subsequent striking against object, initial encounter: Secondary | ICD-10-CM | POA: Diagnosis not present

## 2022-09-14 DIAGNOSIS — S90411A Abrasion, right great toe, initial encounter: Secondary | ICD-10-CM | POA: Diagnosis not present

## 2022-09-14 DIAGNOSIS — T148XXA Other injury of unspecified body region, initial encounter: Secondary | ICD-10-CM | POA: Diagnosis not present

## 2022-09-14 DIAGNOSIS — Z23 Encounter for immunization: Secondary | ICD-10-CM | POA: Diagnosis not present

## 2022-09-29 ENCOUNTER — Ambulatory Visit
Admission: RE | Admit: 2022-09-29 | Discharge: 2022-09-29 | Disposition: A | Discharge status: Home / Self Care | Payer: Medicare PPO | Source: Ambulatory Visit | Attending: Internal Medicine | Admitting: Internal Medicine

## 2022-09-29 DIAGNOSIS — N951 Menopausal and female climacteric states: Secondary | ICD-10-CM | POA: Diagnosis not present

## 2022-09-29 DIAGNOSIS — M810 Age-related osteoporosis without current pathological fracture: Secondary | ICD-10-CM | POA: Diagnosis not present

## 2022-09-29 DIAGNOSIS — Z13820 Encounter for screening for osteoporosis: Secondary | ICD-10-CM

## 2022-11-17 DIAGNOSIS — E118 Type 2 diabetes mellitus with unspecified complications: Secondary | ICD-10-CM | POA: Diagnosis not present

## 2022-11-17 DIAGNOSIS — E782 Mixed hyperlipidemia: Secondary | ICD-10-CM | POA: Diagnosis not present

## 2022-11-23 DIAGNOSIS — E785 Hyperlipidemia, unspecified: Secondary | ICD-10-CM | POA: Diagnosis not present

## 2022-11-23 DIAGNOSIS — I10 Essential (primary) hypertension: Secondary | ICD-10-CM | POA: Diagnosis not present

## 2022-11-23 DIAGNOSIS — I129 Hypertensive chronic kidney disease with stage 1 through stage 4 chronic kidney disease, or unspecified chronic kidney disease: Secondary | ICD-10-CM | POA: Diagnosis not present

## 2022-11-23 DIAGNOSIS — R945 Abnormal results of liver function studies: Secondary | ICD-10-CM | POA: Diagnosis not present

## 2022-11-23 DIAGNOSIS — E118 Type 2 diabetes mellitus with unspecified complications: Secondary | ICD-10-CM | POA: Diagnosis not present

## 2022-11-23 DIAGNOSIS — N182 Chronic kidney disease, stage 2 (mild): Secondary | ICD-10-CM | POA: Diagnosis not present

## 2022-11-23 DIAGNOSIS — E782 Mixed hyperlipidemia: Secondary | ICD-10-CM | POA: Diagnosis not present

## 2022-11-23 DIAGNOSIS — K219 Gastro-esophageal reflux disease without esophagitis: Secondary | ICD-10-CM | POA: Diagnosis not present

## 2022-11-23 DIAGNOSIS — E1122 Type 2 diabetes mellitus with diabetic chronic kidney disease: Secondary | ICD-10-CM | POA: Diagnosis not present

## 2023-01-12 DIAGNOSIS — R350 Frequency of micturition: Secondary | ICD-10-CM | POA: Diagnosis not present

## 2023-01-20 DIAGNOSIS — R300 Dysuria: Secondary | ICD-10-CM | POA: Diagnosis not present

## 2023-02-23 DIAGNOSIS — I10 Essential (primary) hypertension: Secondary | ICD-10-CM | POA: Diagnosis not present

## 2023-02-23 DIAGNOSIS — E782 Mixed hyperlipidemia: Secondary | ICD-10-CM | POA: Diagnosis not present

## 2023-02-23 DIAGNOSIS — E119 Type 2 diabetes mellitus without complications: Secondary | ICD-10-CM | POA: Diagnosis not present

## 2023-03-02 DIAGNOSIS — I129 Hypertensive chronic kidney disease with stage 1 through stage 4 chronic kidney disease, or unspecified chronic kidney disease: Secondary | ICD-10-CM | POA: Diagnosis not present

## 2023-03-02 DIAGNOSIS — E118 Type 2 diabetes mellitus with unspecified complications: Secondary | ICD-10-CM | POA: Diagnosis not present

## 2023-03-02 DIAGNOSIS — E782 Mixed hyperlipidemia: Secondary | ICD-10-CM | POA: Diagnosis not present

## 2023-03-02 DIAGNOSIS — I10 Essential (primary) hypertension: Secondary | ICD-10-CM | POA: Diagnosis not present

## 2023-03-02 DIAGNOSIS — N182 Chronic kidney disease, stage 2 (mild): Secondary | ICD-10-CM | POA: Diagnosis not present

## 2023-03-02 DIAGNOSIS — J302 Other seasonal allergic rhinitis: Secondary | ICD-10-CM | POA: Diagnosis not present

## 2023-03-02 DIAGNOSIS — K219 Gastro-esophageal reflux disease without esophagitis: Secondary | ICD-10-CM | POA: Diagnosis not present

## 2023-03-02 DIAGNOSIS — E785 Hyperlipidemia, unspecified: Secondary | ICD-10-CM | POA: Diagnosis not present

## 2023-03-02 DIAGNOSIS — E1122 Type 2 diabetes mellitus with diabetic chronic kidney disease: Secondary | ICD-10-CM | POA: Diagnosis not present

## 2023-03-10 DIAGNOSIS — H6123 Impacted cerumen, bilateral: Secondary | ICD-10-CM | POA: Diagnosis not present

## 2023-03-10 DIAGNOSIS — W19XXXA Unspecified fall, initial encounter: Secondary | ICD-10-CM | POA: Diagnosis not present

## 2023-03-10 DIAGNOSIS — W19XXXD Unspecified fall, subsequent encounter: Secondary | ICD-10-CM | POA: Diagnosis not present

## 2023-03-10 DIAGNOSIS — R42 Dizziness and giddiness: Secondary | ICD-10-CM | POA: Diagnosis not present

## 2023-03-15 ENCOUNTER — Other Ambulatory Visit: Payer: Self-pay | Admitting: Internal Medicine

## 2023-03-15 DIAGNOSIS — Z1231 Encounter for screening mammogram for malignant neoplasm of breast: Secondary | ICD-10-CM

## 2023-03-23 DIAGNOSIS — C50911 Malignant neoplasm of unspecified site of right female breast: Secondary | ICD-10-CM | POA: Diagnosis not present

## 2023-03-25 DIAGNOSIS — C50911 Malignant neoplasm of unspecified site of right female breast: Secondary | ICD-10-CM | POA: Diagnosis not present

## 2023-03-29 DIAGNOSIS — F419 Anxiety disorder, unspecified: Secondary | ICD-10-CM | POA: Diagnosis not present

## 2023-03-29 DIAGNOSIS — F329 Major depressive disorder, single episode, unspecified: Secondary | ICD-10-CM | POA: Diagnosis not present

## 2023-04-16 ENCOUNTER — Ambulatory Visit: Payer: Medicare PPO

## 2023-05-07 DIAGNOSIS — Z794 Long term (current) use of insulin: Secondary | ICD-10-CM | POA: Diagnosis not present

## 2023-05-07 DIAGNOSIS — E119 Type 2 diabetes mellitus without complications: Secondary | ICD-10-CM | POA: Diagnosis not present

## 2023-05-07 DIAGNOSIS — Z7984 Long term (current) use of oral hypoglycemic drugs: Secondary | ICD-10-CM | POA: Diagnosis not present

## 2023-05-07 DIAGNOSIS — Z961 Presence of intraocular lens: Secondary | ICD-10-CM | POA: Diagnosis not present

## 2023-05-14 DIAGNOSIS — F329 Major depressive disorder, single episode, unspecified: Secondary | ICD-10-CM | POA: Diagnosis not present

## 2023-05-14 DIAGNOSIS — F419 Anxiety disorder, unspecified: Secondary | ICD-10-CM | POA: Diagnosis not present

## 2023-05-20 ENCOUNTER — Ambulatory Visit: Payer: Medicare PPO

## 2023-06-10 ENCOUNTER — Ambulatory Visit
Admission: RE | Admit: 2023-06-10 | Discharge: 2023-06-10 | Disposition: A | Discharge status: Home / Self Care | Payer: Medicare PPO | Source: Ambulatory Visit | Attending: Internal Medicine | Admitting: Internal Medicine

## 2023-06-10 DIAGNOSIS — Z1231 Encounter for screening mammogram for malignant neoplasm of breast: Secondary | ICD-10-CM | POA: Diagnosis not present

## 2023-06-10 DIAGNOSIS — E782 Mixed hyperlipidemia: Secondary | ICD-10-CM | POA: Diagnosis not present

## 2023-06-10 DIAGNOSIS — I10 Essential (primary) hypertension: Secondary | ICD-10-CM | POA: Diagnosis not present

## 2023-06-10 DIAGNOSIS — E119 Type 2 diabetes mellitus without complications: Secondary | ICD-10-CM | POA: Diagnosis not present

## 2023-06-10 HISTORY — DX: Personal history of irradiation: Z92.3

## 2023-06-10 HISTORY — DX: Personal history of antineoplastic chemotherapy: Z92.21

## 2023-06-16 DIAGNOSIS — I10 Essential (primary) hypertension: Secondary | ICD-10-CM | POA: Diagnosis not present

## 2023-06-16 DIAGNOSIS — I129 Hypertensive chronic kidney disease with stage 1 through stage 4 chronic kidney disease, or unspecified chronic kidney disease: Secondary | ICD-10-CM | POA: Diagnosis not present

## 2023-06-16 DIAGNOSIS — E782 Mixed hyperlipidemia: Secondary | ICD-10-CM | POA: Diagnosis not present

## 2023-06-16 DIAGNOSIS — E118 Type 2 diabetes mellitus with unspecified complications: Secondary | ICD-10-CM | POA: Diagnosis not present

## 2023-06-16 DIAGNOSIS — R945 Abnormal results of liver function studies: Secondary | ICD-10-CM | POA: Diagnosis not present

## 2023-06-16 DIAGNOSIS — E1122 Type 2 diabetes mellitus with diabetic chronic kidney disease: Secondary | ICD-10-CM | POA: Diagnosis not present

## 2023-06-16 DIAGNOSIS — F329 Major depressive disorder, single episode, unspecified: Secondary | ICD-10-CM | POA: Diagnosis not present

## 2023-06-16 DIAGNOSIS — N182 Chronic kidney disease, stage 2 (mild): Secondary | ICD-10-CM | POA: Diagnosis not present

## 2023-06-16 DIAGNOSIS — K219 Gastro-esophageal reflux disease without esophagitis: Secondary | ICD-10-CM | POA: Diagnosis not present

## 2023-07-16 DIAGNOSIS — E118 Type 2 diabetes mellitus with unspecified complications: Secondary | ICD-10-CM | POA: Diagnosis not present

## 2023-07-16 DIAGNOSIS — F329 Major depressive disorder, single episode, unspecified: Secondary | ICD-10-CM | POA: Diagnosis not present

## 2023-07-16 DIAGNOSIS — I10 Essential (primary) hypertension: Secondary | ICD-10-CM | POA: Diagnosis not present

## 2023-08-09 ENCOUNTER — Telehealth: Payer: Self-pay

## 2023-08-09 NOTE — Progress Notes (Signed)
   08/09/2023  Patient ID: Diana Hall, female   DOB: Sep 30, 1945, 78 y.o.   MRN: [id number]   Patient appeared on insurance report for not passing the quality metrics in 2024:  Medication Adherence for Cholesterol (MAC) Medication Adherence for Hypertension [facility])   Outreach to the patient was not needed today.  Meds Tracking:  Atorvastatin 40 mg - Last filled 90DS on 05/22/23, Last LDL 49 on 06/10/23, next fill due 08/20/23  Losartan 100 mg - Last filled 90DS on 05/20/23, Last BP 140/80 on 07/16/23, next fill due 08/18/23  Plan:   Next visit with PCP 08/13/23, chronic conditions well controlled, patient does not qualify for [facility] or MAC metric yet but will soon. Patient is on insulin so will not qualify for MAD. Will continue to track adherence, scheduled review of fill history for 08/26/23.   Fayette Pho, PharmD

## 2023-08-13 ENCOUNTER — Ambulatory Visit (HOSPITAL_COMMUNITY)
Admission: RE | Admit: 2023-08-13 | Discharge: 2023-08-13 | Disposition: A | Discharge status: Home / Self Care | Source: Ambulatory Visit | Attending: Nurse Practitioner | Admitting: Nurse Practitioner

## 2023-08-13 ENCOUNTER — Encounter: Payer: Self-pay | Admitting: Nurse Practitioner

## 2023-08-13 ENCOUNTER — Other Ambulatory Visit (HOSPITAL_COMMUNITY): Payer: Self-pay | Admitting: Nurse Practitioner

## 2023-08-13 DIAGNOSIS — E119 Type 2 diabetes mellitus without complications: Secondary | ICD-10-CM | POA: Diagnosis not present

## 2023-08-13 DIAGNOSIS — R152 Fecal urgency: Secondary | ICD-10-CM | POA: Insufficient documentation

## 2023-08-13 DIAGNOSIS — Z794 Long term (current) use of insulin: Secondary | ICD-10-CM | POA: Diagnosis not present

## 2023-08-13 DIAGNOSIS — I10 Essential (primary) hypertension: Secondary | ICD-10-CM | POA: Diagnosis not present

## 2023-08-13 DIAGNOSIS — R140 Abdominal distension (gaseous): Secondary | ICD-10-CM | POA: Diagnosis not present

## 2023-08-13 DIAGNOSIS — F418 Other specified anxiety disorders: Secondary | ICD-10-CM | POA: Diagnosis not present

## 2023-08-13 DIAGNOSIS — Z87891 Personal history of nicotine dependence: Secondary | ICD-10-CM | POA: Diagnosis not present

## 2023-08-13 DIAGNOSIS — Z7984 Long term (current) use of oral hypoglycemic drugs: Secondary | ICD-10-CM | POA: Diagnosis not present

## 2023-08-13 DIAGNOSIS — E118 Type 2 diabetes mellitus with unspecified complications: Secondary | ICD-10-CM | POA: Diagnosis not present

## 2023-08-25 ENCOUNTER — Telehealth: Payer: Self-pay

## 2023-08-25 DIAGNOSIS — R350 Frequency of micturition: Secondary | ICD-10-CM | POA: Diagnosis not present

## 2023-08-25 DIAGNOSIS — Z7984 Long term (current) use of oral hypoglycemic drugs: Secondary | ICD-10-CM | POA: Diagnosis not present

## 2023-08-25 DIAGNOSIS — E118 Type 2 diabetes mellitus with unspecified complications: Secondary | ICD-10-CM | POA: Diagnosis not present

## 2023-08-25 DIAGNOSIS — R42 Dizziness and giddiness: Secondary | ICD-10-CM | POA: Diagnosis not present

## 2023-08-25 NOTE — Progress Notes (Signed)
   08/25/2023  Patient ID: Diana Hall, female   DOB: 1946/05/09, 78 y.o.   MRN: [id number]   Referred to me by PCP, Micael Hampshire, FNP for mediation management. Met with [name] face-to-face to discuss medication adjustments related to her hypotension and hypoglycemia.  Medication Management  HTN: Hypotensive today, agreed with holding amlodipine and lisinopril for a day. I counseled her on normal BP range and what the symptoms of hypotension are. I also encouraged her to stay hydrated, she asked about Gatorade and I think that would be fine as well as long as it's a zero sugar Gatorade. If her BP remains low when she restarts her BP meds, I would stop the amlodipine first. I think it would be best to at least keep Losartan 100 mg on board given her hystory of diabetes and microalbuminuria. She's already on the max dose of losartan which is good since maximizing an ACE/Arb is the best straegy to reduce microalbuminuria.    DM: Her fasting blood sugars look great!  I reviewed fasting BG goal of 70-130 and symptoms of hypoglycemia with her. Her A1C was 5.6 in January which correlates to an average blood glucose of 114. I think we could continue to decrease her insulin dose with the goal of eventually getting off insulin all together if her blood sugar remains controlled. Since she dropped from 40 units to 30 units and not much has changed you could continue to drop down in increments of 10 units every week to two weeks and as long as her fasting stays controlled. She agreed to contact us if she had a few consecutive days of high blood sugar.   Fayette Pho, PharmD

## 2023-08-26 ENCOUNTER — Telehealth: Payer: Self-pay

## 2023-08-26 NOTE — Progress Notes (Signed)
     08/26/2023  Patient ID: Diana Hall, female   DOB: December 30, 1945, 78 y.o.   MRN: [id number]   Patient appeared on insurance report for not passing the quality metrics in 2024:  Medication Adherence for Cholesterol (MAC) Medication Adherence for Hypertension [facility])   Outreach to the patient was not needed today. Met with her yesterday to review medications and consult on HTN and DM. Hypotensive yesterday so losartan was held but will likely be resumed. She still had some atorvastatin left but will likely be filling in a couple week and has 1 refill remaining at the pharmacy.  Meds Tracking:  Atorvastatin 40 mg - Last filled 90DS on 05/22/23, Last LDL 49 on 06/10/23, next fill due 08/20/23  Losartan 100 mg - Last filled 90DS on 05/20/23, Last BP 140/80 on 07/16/23, next fill due 08/18/23   Plan:   Next visit with PCP 09/17/23, chronic conditions well controlled, patient does not qualify for [facility] or MAC metric yet. Patient is on insulin so will not qualify for MAD. Will continue to track adherence, scheduled review of fill history for 09/17/23.   Fayette Pho, PharmD

## 2023-09-01 ENCOUNTER — Encounter (INDEPENDENT_AMBULATORY_CARE_PROVIDER_SITE_OTHER): Payer: Self-pay | Admitting: *Deleted

## 2023-09-07 ENCOUNTER — Encounter (INDEPENDENT_AMBULATORY_CARE_PROVIDER_SITE_OTHER): Payer: Self-pay | Admitting: *Deleted

## 2023-09-09 DIAGNOSIS — E119 Type 2 diabetes mellitus without complications: Secondary | ICD-10-CM | POA: Diagnosis not present

## 2023-09-09 DIAGNOSIS — I10 Essential (primary) hypertension: Secondary | ICD-10-CM | POA: Diagnosis not present

## 2023-09-17 ENCOUNTER — Telehealth: Payer: Self-pay

## 2023-09-17 DIAGNOSIS — N182 Chronic kidney disease, stage 2 (mild): Secondary | ICD-10-CM | POA: Diagnosis not present

## 2023-09-17 DIAGNOSIS — R945 Abnormal results of liver function studies: Secondary | ICD-10-CM | POA: Diagnosis not present

## 2023-09-17 DIAGNOSIS — R112 Nausea with vomiting, unspecified: Secondary | ICD-10-CM | POA: Diagnosis not present

## 2023-09-17 DIAGNOSIS — I129 Hypertensive chronic kidney disease with stage 1 through stage 4 chronic kidney disease, or unspecified chronic kidney disease: Secondary | ICD-10-CM | POA: Diagnosis not present

## 2023-09-17 DIAGNOSIS — R200 Anesthesia of skin: Secondary | ICD-10-CM | POA: Diagnosis not present

## 2023-09-17 DIAGNOSIS — E118 Type 2 diabetes mellitus with unspecified complications: Secondary | ICD-10-CM | POA: Diagnosis not present

## 2023-09-17 DIAGNOSIS — K219 Gastro-esophageal reflux disease without esophagitis: Secondary | ICD-10-CM | POA: Diagnosis not present

## 2023-09-17 DIAGNOSIS — E782 Mixed hyperlipidemia: Secondary | ICD-10-CM | POA: Diagnosis not present

## 2023-09-17 DIAGNOSIS — I10 Essential (primary) hypertension: Secondary | ICD-10-CM | POA: Diagnosis not present

## 2023-09-17 NOTE — Progress Notes (Signed)
   09/17/2023  Patient ID: Diana Hall, female   DOB: [birth year], 78 y.o.   MRN: [id number]       09/17/2023  Patient ID: Diana Hall, female   DOB: 09-27-1945, 78 y.o.   MRN: [id number]   Patient appeared on insurance report for not passing the quality metrics in 2024:  Medication Adherence for Cholesterol (MAC) Medication Adherence for Hypertension [facility])   Outreach to the patient was not needed today. Following up with PCP today, discussed down-titration of insulin  and BP med reduction if necessary.  Meds Tracking:  Atorvastatin 40 mg - Last filled 90DS on 08/30/23, Last LDL 49 on 09/09/23, next fill due 11/28/23  Losartan 100 mg - Last filled 90DS on 08/30/23, Last BP 100/60 on 08/25/23, next fill due 11/28/23   Plan:   Next visit with PCP 09/17/23, chronic conditions well controlled, patient does not qualify for [facility] or MAC metric yet. Patient is on insulin  so will not qualify for MAD. Will continue to track adherence, scheduled review of fill history for 12/01/23.   Flint Hummer, PharmD

## 2023-10-22 ENCOUNTER — Ambulatory Visit (INDEPENDENT_AMBULATORY_CARE_PROVIDER_SITE_OTHER): Admitting: Gastroenterology

## 2023-10-22 ENCOUNTER — Encounter (INDEPENDENT_AMBULATORY_CARE_PROVIDER_SITE_OTHER): Payer: Self-pay | Admitting: Gastroenterology

## 2023-10-22 VITALS — BP 170/62 | HR 72 | Temp 97.7°F | Ht 64.0 in | Wt 136.9 lb

## 2023-10-22 DIAGNOSIS — Z8719 Personal history of other diseases of the digestive system: Secondary | ICD-10-CM

## 2023-10-22 DIAGNOSIS — I10 Essential (primary) hypertension: Secondary | ICD-10-CM

## 2023-10-22 DIAGNOSIS — R159 Full incontinence of feces: Secondary | ICD-10-CM | POA: Diagnosis not present

## 2023-10-22 DIAGNOSIS — R197 Diarrhea, unspecified: Secondary | ICD-10-CM | POA: Diagnosis not present

## 2023-10-22 DIAGNOSIS — K5732 Diverticulitis of large intestine without perforation or abscess without bleeding: Secondary | ICD-10-CM | POA: Insufficient documentation

## 2023-10-22 MED ORDER — PSYLLIUM 58.6 % PO PACK: 1.0000 | PACK | Freq: Two times a day (BID) | ORAL | 2 refills | Status: AC

## 2023-10-22 MED ORDER — SUTAB 1479-225-188 MG PO TABS: ORAL_TABLET | ORAL | 0 refills | Status: AC

## 2023-10-22 MED ORDER — POLYETHYLENE GLYCOL 3350 17 G PO PACK: 17.0000 g | PACK | Freq: Two times a day (BID) | ORAL | 2 refills | Status: AC

## 2023-10-22 NOTE — Progress Notes (Signed)
 Diana Hall , M.D. Gastroenterology & Hepatology [facility] Gastroenterology [address] [place], [place] [address] Primary Care Physician: Omie Bickers, MD [address] [place] [address]  Chief Complaint: Diarrhea, fecal incontinence, history of diverticulitis  History of Present Illness: Diana Hall is a 78 y.o. female with hypertension, hyperlipidemia who presents for evaluation of Diarrhea, fecal incontinence, history of diverticulitis.  Patient reports that her main complaint is fecal incontinence where she will have leakage of stool intermittently.  She would have 1 bowel movement daily on Bristol stool scale type VI but would have days where she would not have a bowel movement. The patient denies having any nausea, vomiting, fever, chills, hematochezia, melena, hematemesis, abdominal distention, abdominal pain, jaundice, pruritus or weight loss. Patient does not seem to be aware that she had an episode of diverticulitis in 2022 and never had a follow-up colonoscopy after that  Last GEX:BMWU Last Colonoscopy:2014 at [facility]   FHx: neg for any gastrointestinal/liver disease, no malignancies Social: neg smoking, alcohol or illicit drug use Surgical: Hysterectomy, cholecystectomy  Labs from 05/2023 hemoglobin 14 platelet 170, normal liver enzymes  Past Medical History: Past Medical History:  Diagnosis Date   Allergic rhinitis    Benign essential hypertension    Breast cancer, stage 1 (HCC)    right   Cancer (HCC) 2002   breast cancer   Diabetes mellitus type 2 in obese    Dyslipidemia    Hypertension    Irritable bowel syndrome with diarrhea    Liver dysfunction    Low back pain at multiple sites    Major depressive disorder    Osteoarthritis    Personal history of chemotherapy    Personal history of radiation therapy    Postconcussion syndrome     Past Surgical History: Past Surgical History:  Procedure Laterality  Date   ABDOMINAL HYSTERECTOMY     BREAST BIOPSY Right 08/30/2000   BREAST LUMPECTOMY Right 2002   CHOLECYSTECTOMY     EXCISION / BIOPSY BREAST / NIPPLE / DUCT Right 09/13/2000    Family History: Family History  Problem Relation Age of Onset   Breast cancer Mother 6   Alzheimer's disease Father     Social History: Social History   Tobacco Use  Smoking Status Never  Smokeless Tobacco Never   Social History   Substance and Sexual Activity  Alcohol Use Yes   Comment: rare   Social History   Substance and Sexual Activity  Drug Use No    Allergies: Allergies  Allergen Reactions   Iodinated Contrast Media Anaphylaxis   Penicillins Rash   Calcium Citrate Diarrhea, Nausea And Vomiting and Other (See Comments)   Hydrochlorothiazide Other (See Comments)    Fatigue, weakness, " It dehydrated me and caused dizziness, confusion, extreme exhaustion"    Hydrocodone-Acetaminophen  Itching    Medications: Current Outpatient Medications  Medication Sig Dispense Refill   amLODipine (NORVASC) 2.5 MG tablet Take 2.5 mg by mouth daily.     aspirin  EC 81 MG tablet Take 1 tablet (81 mg total) by mouth daily. Swallow whole. 30 tablet 11   atorvastatin (LIPITOR) 40 MG tablet Take 40 mg by mouth at bedtime.     DULoxetine  (CYMBALTA ) 60 MG capsule Take 60 mg by mouth 2 (two) times daily.      fish oil-omega-3 fatty acids 1000 MG capsule Take 2 g by mouth 2 (two) times daily.      losartan (COZAAR) 100  MG tablet Take 100 mg by mouth daily.     MELOXICAM PO Take by mouth daily.     Multiple Vitamin (MULTIVITAMIN) capsule Take 1 capsule by mouth daily.     polyethylene glycol (MIRALAX / GLYCOLAX) 17 g packet Take 17 g by mouth 2 (two) times daily. 60 packet 2   psyllium (METAMUCIL) 58.6 % packet Take 1 packet by mouth 2 (two) times daily. 60 packet 2   sitaGLIPtin (JANUVIA) 50 MG tablet Take 50 mg by mouth daily.     Hyoscyamine  Sulfate SL (LEVSIN/SL) 0.125 MG SUBL Place 0.125 mg under  the tongue every 6 (six) hours as needed. (Patient not taking: Reported on 10/22/2023) 20 tablet 0   loperamide (IMODIUM) 2 MG capsule Take by mouth 3 (three) times daily as needed for diarrhea or loose stools. (Patient not taking: Reported on 10/22/2023)     Pancrelipase, Lip-Prot-Amyl, (CREON PO) Take 36,000 Units by mouth. Take 2 before each meal and 1 before snacks (9 tablets per day) for pancreatic insufficiency. (Patient not taking: Reported on 10/22/2023)     pantoprazole  (PROTONIX ) 40 MG tablet Take 1 tablet (40 mg total) by mouth daily. (Patient not taking: Reported on 10/22/2023) 30 tablet 0   No current facility-administered medications for this visit.    Review of Systems: GENERAL: negative for malaise, night sweats HEENT: No changes in hearing or vision, no nose bleeds or other nasal problems. NECK: Negative for lumps, goiter, pain and significant neck swelling RESPIRATORY: Negative for cough, wheezing CARDIOVASCULAR: Negative for chest pain, leg swelling, palpitations, orthopnea GI: SEE HPI MUSCULOSKELETAL: Negative for joint pain or swelling, back pain, and muscle pain. SKIN: Negative for lesions, rash HEMATOLOGY Negative for prolonged bleeding, bruising easily, and swollen nodes. ENDOCRINE: Negative for cold or heat intolerance, polyuria, polydipsia and goiter. NEURO: negative for tremor, gait imbalance, syncope and seizures. The remainder of the review of systems is noncontributory.   Physical Exam: BP (!) 170/62 (BP Location: Right Arm, Patient Position: Sitting, Cuff Size: Normal)   Pulse 72   Temp 97.7 F (36.5 C) (Temporal)   Ht 5\' 4"  (1.626 m)   Wt 136 lb 14.4 oz (62.1 kg)   BMI 23.50 kg/m  GENERAL: The patient is AO x3, in no acute distress. HEENT: Head is normocephalic and atraumatic. EOMI are intact. Mouth is well hydrated and without lesions. NECK: Supple. No masses LUNGS: Clear to auscultation. No presence of rhonchi/wheezing/rales. Adequate chest  expansion HEART: RRR, normal s1 and s2. ABDOMEN: Soft, nontender, no guarding, no peritoneal signs, and nondistended. BS +. No masses.  Imaging/Labs: as above  Abdomen Xray  IMPRESSION: Nonobstructed gas pattern with moderate stool.   Ct 2022    IMPRESSION: Acute sigmoid diverticulitis. Focal exophytic soft tissue thickening at the posterior aspect could reflect inflamed diverticulum. No definite abscess on this noncontrast study. Colonoscopy is recommended after treatment exclude lesion.   Small nonobstructing renal calculi.   Aortic atherosclerosis.      Latest Ref Rng & Units 10/18/2021    4:43 PM 01/05/2018    7:22 PM 11/07/2016    5:30 AM  CBC  WBC 4.0 - 10.5 K/uL 11.1  12.2  7.0   Hemoglobin 12.0 - 15.0 g/dL 65.7  84.6  96.2   Hematocrit 36.0 - 46.0 % 42.8  41.3  34.7   Platelets 150 - 400 K/uL 197  254  164    No results found for: "IRON", "TIBC", "FERRITIN"  I personally reviewed and interpreted the available labs,  imaging and endoscopic files.  Impression and Plan:  BRIEA MCENERY is a 78 y.o. female with hypertension, hyperlipidemia who presents for evaluation of Diarrhea, fecal incontinence, history of diverticulitis.  #Diarrhea #Fecal incontinence  Patient has occasional fecal incontinence, has 1 bowel movement daily on Bristol stool 6 but would have days without defecation.  X-ray with moderate stool burden This is likely overflow diarrhea  Will start regulating bowel movement by adding bulking agent and laxative If this fails to improve patient's symptoms may give a trial of cholestyramine in future  Ensure adequate fluid intake: Aim for 8 glasses of water daily. Follow a high fiber diet: Include foods such as dates, prunes, pears, and kiwi. Take Miralax twice a day for the first week, then reduce to once daily thereafter. Use Metamucil twice a day.  #History of Diverticulitis   Patient had CT scan in 2022 with acute sigmoid diverticulitis ( see  above)  but never had a follow-up colonoscopy after that.  Last colonoscopy 2014  The patient and I held a thorough discussion regarding the pathogenesis and natural history of diverticulitis. We discussed that there is no evidence in the literature supporting any specific triggers for diverticulitis besides the use of NSAIDs or high-dose aspirin   I discussed with patient the current recommendation of diagnostic colonoscopy after episode of diverticulitis to rule out any underlying lesion such as polyp and malignancy  Colonoscopy to be as scheduled.  As per patient in 2014 she was not able to tolerate liquid bowel prep, will prescribe her pill prep  I thoroughly discussed with the patient the procedure, including the risks involved. Patient understands what the procedure involves including the benefits and any risks. Patient understands alternatives to the proposed procedure. Risks including (but not limited to) bleeding, tearing of the lining (perforation), rupture of adjacent organs, problems with heart and lung function, infection, and medication reactions. A small percentage of complications may require surgery, hospitalization, repeat endoscopic procedure, and/or transfusion.  Patient understood and agreed.    #HTN The patient was found to have elevated blood pressure when vital signs were checked in the office. The blood pressure was rechecked by the nursing staff and it was found be persistently elevated >140/90 mmHg. I personally advised to the patient to follow up closely with PCP for hypertension control.   All questions were answered.      Yakub Lodes Faizan Juriel Cid, MD Gastroenterology and Hepatology [facility] Gastroenterology   This chart has been completed using [facility] Dictation software, and while attempts have been made to ensure accuracy , certain words and phrases may not be transcribed as intended

## 2023-10-22 NOTE — Addendum Note (Signed)
 Addended by: Danyel Tobey on: 10/22/2023 10:22 AM   Modules accepted: Orders

## 2023-10-22 NOTE — Patient Instructions (Signed)
 It was very nice to meet you today, as dicussed with will plan for the following :  1)Ensure adequate fluid intake: Aim for 8 glasses of water daily. Follow a high fiber diet: Include foods such as dates, prunes, pears, and kiwi. Take Miralax twice a day for the first week, then reduce to once daily thereafter. Use Metamucil twice a day.  (BOTH OF THE ABOVE CAN BE PICKED UP OVER THE COUNTER)    2) Colonoscopy

## 2023-10-25 ENCOUNTER — Telehealth (INDEPENDENT_AMBULATORY_CARE_PROVIDER_SITE_OTHER): Payer: Self-pay | Admitting: Gastroenterology

## 2023-10-25 NOTE — Telephone Encounter (Signed)
 Pt returned call and left medications on voicemail. Pt states she is taking: -Superior probiotic from [facility] -Farxiga 10 mg -Sertraline 25 mg -Losartan 100 mg -Atorvastatin 40 mg -Aspirin  81 mg -30 units of Insulin  daily [facility]ye)  Pt stated in previous voicemail that she was not taking amlodipine, creon, Cymbalta , metamucil, Protonix  or fish oil.    Contacted pt to go over medications once again. Pt states she is also pricking finger once a day for DM. Updated med list and advised pt to take Miralax  and Metamucil as prescribed. Pt verbalized understanding.

## 2023-10-25 NOTE — Addendum Note (Signed)
 Addended by: Bettyjo Lundblad on: 10/25/2023 11:56 AM   Modules accepted: Orders

## 2023-10-25 NOTE — Telephone Encounter (Signed)
 Pt left voicemail stating that she was going over her after visit summary. Pt stated on voicemail that she does not know what the medications are on that list that printed out. Returned call to pt but had to leave a message with husband that pt can either bring meds to office or pt can call back and we can go over medications. Husband stated he will tell wife.

## 2023-11-01 ENCOUNTER — Telehealth (INDEPENDENT_AMBULATORY_CARE_PROVIDER_SITE_OTHER): Payer: Self-pay | Admitting: Gastroenterology

## 2023-11-01 NOTE — Telephone Encounter (Signed)
 Pt left voicemail stating that she is confused by her AVS given on 10/22/23. Pt states some of the medications on that list she has not heard of and not every had. (Pt called last week and we went over her medications-please see telephone message). Pt states no one explained anything during her visit and no one explained her AVS. Returned call to pt but had to leave message; left detailed message asking pt to bring all medications and AVS to office so we can go over everything since that would be easier than going through everything on phone.

## 2023-11-09 ENCOUNTER — Telehealth: Payer: Self-pay | Admitting: *Deleted

## 2023-11-09 NOTE — Telephone Encounter (Signed)
 Pt called in to reschedule her procedure for 6/25 with Dr. Alita Irwin. She rescheduled to 7/28. Aware will send new instructions.  Message sent to endo making aware of change.  Current PA still valid Authorization #[id number] DOS: 10/22/2023 - 01/22/2024

## 2023-12-02 ENCOUNTER — Telehealth: Payer: Self-pay

## 2023-12-02 NOTE — Telephone Encounter (Signed)
 Overdue on meds, unsuccessful outreach. Will try again next week

## 2023-12-07 NOTE — Telephone Encounter (Signed)
 LMTRC  Pt had left a voicemail about her procedure that had been scheduled.

## 2023-12-08 ENCOUNTER — Telehealth: Payer: Self-pay

## 2023-12-08 NOTE — Telephone Encounter (Signed)
 Successful outreach, has about 2 weeks left of losartan and atorvastatin, next review in 2 weeks to confirm refills

## 2023-12-14 ENCOUNTER — Ambulatory Visit (HOSPITAL_COMMUNITY)
Admission: RE | Admit: 2023-12-14 | Discharge: 2023-12-14 | Disposition: A | Discharge status: Home / Self Care | Source: Ambulatory Visit

## 2023-12-14 ENCOUNTER — Encounter (HOSPITAL_COMMUNITY): Payer: Self-pay

## 2023-12-14 ENCOUNTER — Other Ambulatory Visit (HOSPITAL_COMMUNITY): Payer: Self-pay

## 2023-12-14 DIAGNOSIS — K449 Diaphragmatic hernia without obstruction or gangrene: Secondary | ICD-10-CM | POA: Diagnosis not present

## 2023-12-14 DIAGNOSIS — Z87442 Personal history of urinary calculi: Secondary | ICD-10-CM | POA: Diagnosis not present

## 2023-12-14 DIAGNOSIS — R0781 Pleurodynia: Secondary | ICD-10-CM

## 2023-12-14 DIAGNOSIS — M8580 Other specified disorders of bone density and structure, unspecified site: Secondary | ICD-10-CM | POA: Diagnosis not present

## 2023-12-14 DIAGNOSIS — M549 Dorsalgia, unspecified: Secondary | ICD-10-CM | POA: Diagnosis not present

## 2023-12-15 NOTE — OR Nursing (Signed)
 Called patient for preop and wants to cancel colonoscopy.  Office notified.

## 2023-12-20 ENCOUNTER — Ambulatory Visit (HOSPITAL_COMMUNITY): Admit: 2023-12-20 | Admitting: Gastroenterology

## 2023-12-20 ENCOUNTER — Encounter (HOSPITAL_COMMUNITY): Payer: Self-pay

## 2023-12-20 SURGERY — COLONOSCOPY
Anesthesia: Choice

## 2023-12-23 ENCOUNTER — Telehealth: Payer: Self-pay

## 2023-12-23 NOTE — Telephone Encounter (Signed)
 Up to date on meds, next review in November

## 2023-12-24 ENCOUNTER — Other Ambulatory Visit (HOSPITAL_COMMUNITY): Payer: Self-pay

## 2023-12-24 DIAGNOSIS — E118 Type 2 diabetes mellitus with unspecified complications: Secondary | ICD-10-CM | POA: Diagnosis not present

## 2023-12-24 DIAGNOSIS — I10 Essential (primary) hypertension: Secondary | ICD-10-CM | POA: Diagnosis not present

## 2023-12-24 DIAGNOSIS — M545 Low back pain, unspecified: Secondary | ICD-10-CM

## 2023-12-24 DIAGNOSIS — M549 Dorsalgia, unspecified: Secondary | ICD-10-CM | POA: Diagnosis not present

## 2023-12-24 DIAGNOSIS — Z87442 Personal history of urinary calculi: Secondary | ICD-10-CM

## 2023-12-24 DIAGNOSIS — G8929 Other chronic pain: Secondary | ICD-10-CM

## 2023-12-27 ENCOUNTER — Ambulatory Visit (HOSPITAL_COMMUNITY)
Admission: RE | Admit: 2023-12-27 | Discharge: 2023-12-27 | Disposition: A | Discharge status: Home / Self Care | Source: Ambulatory Visit

## 2023-12-27 ENCOUNTER — Ambulatory Visit (HOSPITAL_COMMUNITY): Admission: RE | Admit: 2023-12-27 | Source: Ambulatory Visit

## 2023-12-27 DIAGNOSIS — M5136 Other intervertebral disc degeneration, lumbar region with discogenic back pain only: Secondary | ICD-10-CM | POA: Diagnosis not present

## 2023-12-27 DIAGNOSIS — M545 Low back pain, unspecified: Secondary | ICD-10-CM | POA: Diagnosis not present

## 2023-12-27 DIAGNOSIS — G8929 Other chronic pain: Secondary | ICD-10-CM | POA: Diagnosis not present

## 2023-12-27 DIAGNOSIS — M47817 Spondylosis without myelopathy or radiculopathy, lumbosacral region: Secondary | ICD-10-CM | POA: Diagnosis not present

## 2023-12-27 DIAGNOSIS — M5137 Other intervertebral disc degeneration, lumbosacral region with discogenic back pain only: Secondary | ICD-10-CM | POA: Diagnosis not present

## 2023-12-28 ENCOUNTER — Ambulatory Visit (HOSPITAL_COMMUNITY)
Admission: RE | Admit: 2023-12-28 | Discharge: 2023-12-28 | Disposition: A | Discharge status: Home / Self Care | Source: Ambulatory Visit

## 2023-12-28 DIAGNOSIS — N200 Calculus of kidney: Secondary | ICD-10-CM | POA: Diagnosis not present

## 2023-12-28 DIAGNOSIS — Z87442 Personal history of urinary calculi: Secondary | ICD-10-CM | POA: Diagnosis not present

## 2024-01-17 DIAGNOSIS — I129 Hypertensive chronic kidney disease with stage 1 through stage 4 chronic kidney disease, or unspecified chronic kidney disease: Secondary | ICD-10-CM | POA: Diagnosis not present

## 2024-01-17 DIAGNOSIS — N182 Chronic kidney disease, stage 2 (mild): Secondary | ICD-10-CM | POA: Diagnosis not present

## 2024-01-17 DIAGNOSIS — E782 Mixed hyperlipidemia: Secondary | ICD-10-CM | POA: Diagnosis not present

## 2024-01-17 DIAGNOSIS — F329 Major depressive disorder, single episode, unspecified: Secondary | ICD-10-CM | POA: Diagnosis not present

## 2024-01-17 DIAGNOSIS — I10 Essential (primary) hypertension: Secondary | ICD-10-CM | POA: Diagnosis not present

## 2024-01-17 DIAGNOSIS — R945 Abnormal results of liver function studies: Secondary | ICD-10-CM | POA: Diagnosis not present

## 2024-01-17 DIAGNOSIS — R809 Proteinuria, unspecified: Secondary | ICD-10-CM | POA: Diagnosis not present

## 2024-01-17 DIAGNOSIS — E118 Type 2 diabetes mellitus with unspecified complications: Secondary | ICD-10-CM | POA: Diagnosis not present

## 2024-01-17 DIAGNOSIS — R112 Nausea with vomiting, unspecified: Secondary | ICD-10-CM | POA: Diagnosis not present

## 2024-02-22 ENCOUNTER — Ambulatory Visit (INDEPENDENT_AMBULATORY_CARE_PROVIDER_SITE_OTHER): Admitting: Gastroenterology

## 2024-02-22 ENCOUNTER — Encounter (INDEPENDENT_AMBULATORY_CARE_PROVIDER_SITE_OTHER): Payer: Self-pay | Admitting: Gastroenterology

## 2024-02-22 VITALS — BP 135/81 | HR 71 | Temp 97.8°F | Ht 64.0 in | Wt 136.4 lb

## 2024-02-22 DIAGNOSIS — K5732 Diverticulitis of large intestine without perforation or abscess without bleeding: Secondary | ICD-10-CM

## 2024-02-22 DIAGNOSIS — Z8719 Personal history of other diseases of the digestive system: Secondary | ICD-10-CM

## 2024-02-22 DIAGNOSIS — R197 Diarrhea, unspecified: Secondary | ICD-10-CM

## 2024-02-22 NOTE — Patient Instructions (Signed)
 Please continue metamucil for constipation Increase water intake, aim for atleast 64 oz per day Increase fruits, veggies and whole grains, kiwi and prunes are especially good for constipation We will get you scheduled for colonoscopy due to your history of diverticulitis in 2022 that was never followed up with evaluation of the colon  It was a pleasure to see you today. I want to create trusting relationships with patients and provide genuine, compassionate, and quality care. I truly value your feedback! please be on the lookout for a survey regarding your visit with me today. I appreciate your input about our visit and your time in completing this!    Artelia Game L. Daryan Buell, MSN, APRN, AGNP-C Adult-Gerontology Nurse Practitioner [facility] Gastroenterology at [facility]

## 2024-02-22 NOTE — Progress Notes (Signed)
 Referring Provider: Shona Norleen PEDLAR, MD Primary Care Physician:  Shona Norleen PEDLAR, MD Primary GI Physician: Dr. Cinderella   Chief Complaint  Patient presents with   Follow-up    Pt is unsure if wants to do colonoscopy.   HPI:   Diana Hall is a 78 y.o. female with past medical history of hypertension, hyperlipidemia   Patient presenting today for:  Follow up of diarrhea, fecal incontinence  Encounter for colonoscopy due to history of diverticulitis   Last seen in May, By Dr. Cinderella, at that time reported leakage of stool, intermittently, 1 BM daily, type VI on bristol stool scale, some days without BMs. Last episode of diverticulitis in 2022  Recommended Miralax  twice a day for the first week, then reduce to once daily thereafter, Use Metamucil twice a day, schedule colonoscopy due to last TCS being prior to last episode of diverticulitis.  Colonoscopy was scheduled but canceled  Present: She states that she did not understand after her last visit where the stool on the xray was. She was concerned it was outside of her colon. She reports that she did not understand the reason for the colonoscopy or understand the instructions. She reports she did start metamucil daily and diarrhea that she had before is much improved. She has had no further issues with fecal incontinence. She is having a couple of BMs per day now. She denies abdominal pain.  No red flag symptoms. Patient denies melena, hematochezia, nausea, vomiting, diarrhea, constipation, dysphagia, odyonophagia, early satiety or weight loss.    DG Abd 1 view: 07/2023 Nonobstructed gas pattern with moderate stool  Last ZHI:wnwz Last Colonoscopy:2014 at [facility]  Past Medical History:  Diagnosis Date   Allergic rhinitis    Benign essential hypertension    Breast cancer, stage 1 (HCC)    right   Cancer (HCC) 2002   breast cancer   Diabetes mellitus type 2 in obese    Dyslipidemia    Hypertension    Irritable bowel syndrome with  diarrhea    Liver dysfunction    Low back pain at multiple sites    Major depressive disorder    Osteoarthritis    Personal history of chemotherapy    Personal history of radiation therapy    Postconcussion syndrome     Past Surgical History:  Procedure Laterality Date   ABDOMINAL HYSTERECTOMY     BREAST BIOPSY Right 08/30/2000   BREAST LUMPECTOMY Right 2002   CHOLECYSTECTOMY     EXCISION / BIOPSY BREAST / NIPPLE / DUCT Right 09/13/2000    Current Outpatient Medications  Medication Sig Dispense Refill   atorvastatin (LIPITOR) 40 MG tablet Take 40 mg by mouth at bedtime.     FARXIGA 10 MG TABS tablet Take 10 mg by mouth daily.     Insulin  Degludec (TRESIBA [place]) Inject into the skin. 30 units     losartan (COZAAR) 100 MG tablet Take 100 mg by mouth daily.     psyllium (METAMUCIL) 58.6 % powder Take 1 packet by mouth 3 (three) times daily.     sertraline (ZOLOFT) 25 MG tablet Take 25 mg by mouth daily.     sitaGLIPtin (JANUVIA) 100 MG tablet Take 100 mg by mouth daily.     Sodium Sulfate-Mag Sulfate-KCl (SUTAB ) [phone] MG TABS As directed 24 tablet 0   aspirin  EC 81 MG tablet Take 1 tablet (81 mg total) by mouth daily. Swallow whole. (Patient not taking: Reported on 02/22/2024) 30 tablet 11  Probiotic Product (PROBIOTIC DAILY PO) Take by mouth.     No current facility-administered medications for this visit.    Allergies as of 02/22/2024 - Review Complete 02/22/2024  Allergen Reaction Noted   Iodinated contrast media Anaphylaxis 10/09/2011   Penicillins Rash 10/09/2011   Calcium citrate Diarrhea, Nausea And Vomiting, and Other (See Comments) 07/21/2017   Hydrochlorothiazide Other (See Comments) 01/06/2017   Hydrocodone-acetaminophen  Itching 10/09/2011    Social History   Socioeconomic History   Marital status: Married    Spouse name: Not on file   Number of children: Not on file   Years of education: Not on file   Highest education level: Master's degree (e.g., MA,  MS, MEng, MEd, MSW, MBA)  Occupational History   Occupation: Retired  Tobacco Use   Smoking status: Never   Smokeless tobacco: Never  Vaping Use   Vaping status: Never Used  Substance and Sexual Activity   Alcohol use: Yes    Comment: rare   Drug use: No   Sexual activity: Not on file  Other Topics Concern   Not on file  Social History Narrative   Lives at home with husband and pets   Right handed   Caffeine: maybe 2-3 cups of tea    Social Drivers of Corporate investment banker Strain: Not on file  Food Insecurity: Not on file  Transportation Needs: Not on file  Physical Activity: Not on file  Stress: Not on file  Social Connections: Not on file    Review of systems General: negative for malaise, night sweats, fever, chills, weight loss Neck: Negative for lumps, goiter, pain and significant neck swelling Resp: Negative for cough, wheezing, dyspnea at rest CV: Negative for chest pain, leg swelling, palpitations, orthopnea GI: denies melena, hematochezia, nausea, vomiting, diarrhea, constipation, dysphagia, odyonophagia, early satiety or unintentional weight loss.  MSK: Negative for joint pain or swelling, back pain, and muscle pain. Derm: Negative for itching or rash Psych: Denies depression, anxiety, memory loss, confusion. No homicidal or suicidal ideation.  Heme: Negative for prolonged bleeding, bruising easily, and swollen nodes. Endocrine: Negative for cold or heat intolerance, polyuria, polydipsia and goiter. Neuro: negative for tremor, gait imbalance, syncope and seizures. The remainder of the review of systems is noncontributory.  Physical Exam: BP 135/81 (BP Location: Right Arm, Patient Position: Sitting, Cuff Size: Normal)   Pulse 71   Temp 97.8 F (36.6 C) (Oral)   Ht 5' 4 (1.626 m)   Wt 136 lb 6.4 oz (61.9 kg)   BMI 23.41 kg/m  General:   Alert and oriented. No distress noted. Pleasant and cooperative.  Head:  Normocephalic and atraumatic. Eyes:   Conjuctiva clear without scleral icterus. Mouth:  Oral mucosa pink and moist. Good dentition. No lesions. Heart: Normal rate and rhythm, s1 and s2 heart sounds present.  Lungs: Clear lung sounds in all lobes. Respirations equal and unlabored. Abdomen:  +BS, soft, non-tender and non-distended. No rebound or guarding. No HSM or masses noted. Derm: No palmar erythema or jaundice Msk:  Symmetrical without gross deformities. Normal posture. Extremities:  Without edema. Neurologic:  Alert and  oriented x4 Psych:  Alert and cooperative. Normal mood and affect.  Invalid input(s): 6 MONTHS   ASSESSMENT: Diana Hall is a 78 y.o. female presenting today for follow up of diarrhea/fecal incontinence and encounter for colonoscopy due to history of diverticulitis  Diarrhea and fecal incontinence suspected to be overflow as symptoms resolved with use of metamucil. She has 1-2 stools  per day with no abdominal pain, rectal bleeding or melena. Should continue with metamucil, good water intake, high fiber diet  At last visit, patient was recommended to have a colonoscopy as her last was in 2014 and last episode of diverticulitis confirmed via CT was in 2022 with no new follow-up patient with history of colon thereafter.  It appears this was explained to the patient today she tells me she did not understand the reason for the colonoscopy did not feel comfortable proceeding with this.  Had a very thorough conversation with the patient and her husband today regarding reason for colonoscopy especially after an episode of diverticulitis and some rare cases and malignancy can masquerade as diverticulitis, therefore it is important to have visualization of the colon thereafter. I discussed in depth the idications, risks and benefits of procedure discussed in detail with patient and her husband. Patient verbalized understanding and is in agreement to proceed with Colonoscopy.   PLAN:  -schedule colonoscopy ASA II   -continue metamucil daily -Increase water intake, aim for atleast 64 oz per day -Increase fruits, veggies and whole grains, kiwi and prunes are especially good for constipation  All questions were answered, patient verbalized understanding and is in agreement with plan as outlined above.   Follow Up: TBD after colonoscopy   Charon Smedberg L. Mushka Laconte, MSN, APRN, AGNP-C Adult-Gerontology Nurse Practitioner [facility]

## 2024-02-24 ENCOUNTER — Telehealth: Payer: Self-pay | Admitting: *Deleted

## 2024-02-24 NOTE — Telephone Encounter (Signed)
 Per cohere Prior authorization is not required for this code. If you would like to submit this code for review, please login to [employer], or contact [employer]; for Medicare call [phone], for Commercial call [phone]

## 2024-02-24 NOTE — Telephone Encounter (Signed)
 Spoke with pt. Scheduled for TCS with Dr. Cinderella, asa 2 on 11/3. Aware will mail instructions. She already has sutabs at home.

## 2024-03-27 ENCOUNTER — Ambulatory Visit (HOSPITAL_COMMUNITY): Admitting: Certified Registered"

## 2024-03-27 ENCOUNTER — Encounter (HOSPITAL_COMMUNITY)
Admission: RE | Disposition: A | Discharge status: Home / Self Care | Payer: Self-pay | Source: Home / Self Care | Attending: Gastroenterology

## 2024-03-27 ENCOUNTER — Ambulatory Visit (HOSPITAL_COMMUNITY)
Admission: RE | Admit: 2024-03-27 | Discharge: 2024-03-27 | Disposition: A | Discharge status: Home / Self Care | Attending: Gastroenterology | Admitting: Gastroenterology

## 2024-03-27 ENCOUNTER — Other Ambulatory Visit: Payer: Self-pay

## 2024-03-27 DIAGNOSIS — Z09 Encounter for follow-up examination after completed treatment for conditions other than malignant neoplasm: Secondary | ICD-10-CM | POA: Insufficient documentation

## 2024-03-27 DIAGNOSIS — K5730 Diverticulosis of large intestine without perforation or abscess without bleeding: Secondary | ICD-10-CM

## 2024-03-27 DIAGNOSIS — K6289 Other specified diseases of anus and rectum: Secondary | ICD-10-CM

## 2024-03-27 DIAGNOSIS — E119 Type 2 diabetes mellitus without complications: Secondary | ICD-10-CM | POA: Insufficient documentation

## 2024-03-27 DIAGNOSIS — D120 Benign neoplasm of cecum: Secondary | ICD-10-CM

## 2024-03-27 DIAGNOSIS — K635 Polyp of colon: Secondary | ICD-10-CM

## 2024-03-27 DIAGNOSIS — E785 Hyperlipidemia, unspecified: Secondary | ICD-10-CM | POA: Diagnosis not present

## 2024-03-27 DIAGNOSIS — I10 Essential (primary) hypertension: Secondary | ICD-10-CM | POA: Diagnosis not present

## 2024-03-27 DIAGNOSIS — Z8673 Personal history of transient ischemic attack (TIA), and cerebral infarction without residual deficits: Secondary | ICD-10-CM | POA: Insufficient documentation

## 2024-03-27 DIAGNOSIS — K648 Other hemorrhoids: Secondary | ICD-10-CM

## 2024-03-27 DIAGNOSIS — D122 Benign neoplasm of ascending colon: Secondary | ICD-10-CM

## 2024-03-27 DIAGNOSIS — D123 Benign neoplasm of transverse colon: Secondary | ICD-10-CM | POA: Diagnosis not present

## 2024-03-27 DIAGNOSIS — K5140 Inflammatory polyps of colon without complications: Secondary | ICD-10-CM | POA: Diagnosis not present

## 2024-03-27 HISTORY — PX: COLONOSCOPY: SHX5424

## 2024-03-27 LAB — GLUCOSE, CAPILLARY
Glucose-Capillary: 57 mg/dL — ABNORMAL LOW (ref 70–99)
Glucose-Capillary: 99 mg/dL (ref 70–99)

## 2024-03-27 LAB — HM COLONOSCOPY

## 2024-03-27 SURGERY — COLONOSCOPY
Anesthesia: General

## 2024-03-27 MED ORDER — PROPOFOL 10 MG/ML IV BOLUS
INTRAVENOUS | Status: DC | PRN
  Administered 2024-03-27: 150 ug/kg/min via INTRAVENOUS
  Administered 2024-03-27: 80 mg via INTRAVENOUS

## 2024-03-27 MED ORDER — DEXTROSE 50 % IV SOLN
25.0000 mL | Freq: Once | INTRAVENOUS | Status: AC
  Administered 2024-03-27: 25 mL via INTRAVENOUS

## 2024-03-27 MED ORDER — LIDOCAINE HCL (CARDIAC) PF 100 MG/5ML IV SOSY
PREFILLED_SYRINGE | INTRAVENOUS | Status: DC | PRN
  Administered 2024-03-27: 30 mg via INTRAVENOUS

## 2024-03-27 MED ORDER — DEXTROSE 50 % IV SOLN
INTRAVENOUS | Status: AC
  Filled 2024-03-27: qty 50

## 2024-03-27 MED ORDER — LACTATED RINGERS IV SOLN: INTRAVENOUS | Status: DC

## 2024-03-27 NOTE — H&P (Signed)
 Primary Care Physician:  Shona Norleen PEDLAR, MD Primary Gastroenterologist:  Dr. Cinderella  Pre-Procedure History & Physical: HPI: Diana Hall is a 78 y.o. female with hypertension, hyperlipidemia who presents for evaluation of Diarrhea, fecal incontinence, history of diverticulitis.   Patient reports that her main complaint is fecal incontinence where she will have leakage of stool intermittently.  She would have 1 bowel movement daily on Bristol stool scale type VI but would have days where she would not have a bowel movement. The patient denies having any nausea, vomiting, fever, chills, hematochezia, melena, hematemesis, abdominal distention, abdominal pain, jaundice, pruritus or weight loss. Patient does not seem to be aware that she had an episode of diverticulitis in 2022 and never had a follow-up colonoscopy after that   Last ZHI:wnwz Last Colonoscopy:2014 at [facility]    FHx: neg for any gastrointestinal/liver disease, no malignancies Social: neg smoking, alcohol or illicit drug use Surgical: Hysterectomy, cholecystectomy   Labs from 05/2023 hemoglobin 14 platelet 170, normal liver enzymes    Past Medical History:  Diagnosis Date   Allergic rhinitis    Benign essential hypertension    Breast cancer, stage 1 (HCC)    right   Cancer (HCC) 2002   breast cancer   Diabetes mellitus type 2 in obese    Dyslipidemia    Hypertension    Irritable bowel syndrome with diarrhea    Liver dysfunction    Low back pain at multiple sites    Major depressive disorder    Osteoarthritis    Personal history of chemotherapy    Personal history of radiation therapy    Postconcussion syndrome     Past Surgical History:  Procedure Laterality Date   ABDOMINAL HYSTERECTOMY     BREAST BIOPSY Right 08/30/2000   BREAST LUMPECTOMY Right 2002   CHOLECYSTECTOMY     EXCISION / BIOPSY BREAST / NIPPLE / DUCT Right 09/13/2000    Prior to Admission medications   Medication Sig Start Date End Date Taking?  Authorizing Provider  aspirin  EC 81 MG tablet Take 1 tablet (81 mg total) by mouth daily. Swallow whole. Patient not taking: Reported on 02/22/2024 09/11/20   Ines Onetha NOVAK, MD  atorvastatin (LIPITOR) 40 MG tablet Take 40 mg by mouth at bedtime.    [provider]  FARXIGA 10 MG TABS tablet Take 10 mg by mouth daily. 11/27/23   [provider]  Insulin  Degludec (TRESIBA [place]) Inject into the skin. 30 units    [provider]  losartan (COZAAR) 100 MG tablet Take 100 mg by mouth daily.    [provider]  Probiotic Product (PROBIOTIC DAILY PO) Take by mouth.    [provider]  psyllium (METAMUCIL) 58.6 % powder Take 1 packet by mouth 3 (three) times daily.    [provider]  sertraline (ZOLOFT) 25 MG tablet Take 25 mg by mouth daily.    [provider]  sitaGLIPtin (JANUVIA) 100 MG tablet Take 100 mg by mouth daily.    [provider]  Sodium Sulfate-Mag Sulfate-KCl (SUTAB ) [phone] MG TABS As directed 10/22/23   Cinderella Deatrice FALCON, MD    Allergies as of 02/24/2024 - Review Complete 02/22/2024  Allergen Reaction Noted   Iodinated contrast media Anaphylaxis 10/09/2011   Penicillins Rash 10/09/2011   Calcium citrate Diarrhea, Nausea And Vomiting, and Other (See Comments) 07/21/2017   Hydrochlorothiazide Other (See Comments) 01/06/2017   Hydrocodone-acetaminophen  Itching 10/09/2011    Family History  Problem Relation Age of Onset  Breast cancer Mother 19   Alzheimer's disease Father     Social History   Socioeconomic History   Marital status: Married    Spouse name: Not on file   Number of children: Not on file   Years of education: Not on file   Highest education level: Master's degree (e.g., MA, MS, MEng, MEd, MSW, MBA)  Occupational History   Occupation: Retired  Tobacco Use   Smoking status: Never   Smokeless tobacco: Never  Vaping Use   Vaping status: Never Used  Substance and Sexual Activity    Alcohol use: Yes    Comment: rare   Drug use: No   Sexual activity: Not on file  Other Topics Concern   Not on file  Social History Narrative   Lives at home with husband and pets   Right handed   Caffeine: maybe 2-3 cups of tea    Social Drivers of Corporate Investment Banker Strain: Not on file  Food Insecurity: Not on file  Transportation Needs: Not on file  Physical Activity: Not on file  Stress: Not on file  Social Connections: Not on file  Intimate Partner Violence: Not on file    Review of Systems: See HPI, otherwise negative ROS  Physical Exam: Vital signs in last 24 hours:     General:   Alert,  Well-developed, well-nourished, pleasant and cooperative in NAD Head:  Normocephalic and atraumatic. Eyes:  Sclera clear, no icterus.   Conjunctiva pink. Ears:  Normal auditory acuity. Nose:  No deformity, discharge,  or lesions. Msk:  Symmetrical without gross deformities. Normal posture. Extremities:  Without clubbing or edema. Neurologic:  Alert and  oriented x4;  grossly normal neurologically. Skin:  Intact without significant lesions or rashes. Psych:  Alert and cooperative. Normal mood and affect.  Impression/Plan:  Diana Hall is a 78 y.o. female with hypertension, hyperlipidemia who presents for evaluation of Diarrhea, fecal incontinence, history of diverticulitis.  Proceed with colonoscopy   The risks of the procedure including infection, bleed, or perforation as well as benefits, limitations, alternatives and imponderables have been reviewed with the patient. Questions have been answered. All parties agreeable.

## 2024-03-27 NOTE — Discharge Instructions (Signed)
   Discharge instructions Please read the instructions outlined below and refer to this sheet in the next few weeks. These discharge instructions provide you with general information on caring for yourself after you leave the hospital. Your doctor may also give you specific instructions. While your treatment has been planned according to the most current medical practices available, unavoidable complications occasionally occur. If you have any problems or questions after discharge, please call your doctor. ACTIVITY You may resume your regular activity but move at a slower pace for the next 24 hours.  Take frequent rest periods for the next 24 hours.  Walking will help expel (get rid of) the air and reduce the bloated feeling in your abdomen.  No driving for 24 hours (because of the anesthesia (medicine) used during the test).  You may shower.  Do not sign any important legal documents or operate any machinery for 24 hours (because of the anesthesia used during the test).  NUTRITION Drink plenty of fluids.  You may resume your normal diet.  Begin with a light meal and progress to your normal diet.  Avoid alcoholic beverages for 24 hours or as instructed by your caregiver.  MEDICATIONS You may resume your normal medications unless your caregiver tells you otherwise.  WHAT YOU CAN EXPECT TODAY You may experience abdominal discomfort such as a feeling of fullness or "gas" pains.  FOLLOW-UP Your doctor will discuss the results of your test with you.  SEEK IMMEDIATE MEDICAL ATTENTION IF ANY OF THE FOLLOWING OCCUR: Excessive nausea (feeling sick to your stomach) and/or vomiting.  Severe abdominal pain and distention (swelling).  Trouble swallowing.  Temperature over 101 F (37.8 C).  Rectal bleeding or vomiting of blood.    Avoid using high dose aspirin including Goody/BC powders, NSAIDs such as Aleve, ibuprofen, naproxen, Motrin, Voltaren or Advil (even the topical ones)    I hope you have a  great rest of your week!   Kaceton Vieau Faizan Ayushi Pla , M.D.. Gastroenterology and Hepatology [facility]

## 2024-03-27 NOTE — Anesthesia Preprocedure Evaluation (Signed)
 Anesthesia Evaluation  Patient identified by MRN, date of birth, ID band Patient awake    Reviewed: Allergy & Precautions, H&P , NPO status , Patient's Chart, lab work & pertinent test results, reviewed documented beta blocker date and time   Airway Mallampati: II  TM Distance: >3 FB Neck ROM: full    Dental no notable dental hx. (+) Dental Advisory Given   Pulmonary neg pulmonary ROS   Pulmonary exam normal breath sounds clear to auscultation       Cardiovascular hypertension, Normal cardiovascular exam Rhythm:regular Rate:Normal     Neuro/Psych  PSYCHIATRIC DISORDERS  Depression    TIA   GI/Hepatic negative GI ROS,,,Liver disorder   Endo/Other  diabetes, Type 2    Renal/GU negative Renal ROS  negative genitourinary   Musculoskeletal  (+) Arthritis , Osteoarthritis,    Abdominal   Peds  Hematology negative hematology ROS (+)   Anesthesia Other Findings Stage 1 breast cancer  Reproductive/Obstetrics negative OB ROS                              Anesthesia Physical Anesthesia Plan  ASA: 3  Anesthesia Plan: General   Post-op Pain Management: Minimal or no pain anticipated   Induction: Intravenous  PONV Risk Score and Plan: Propofol infusion  Airway Management Planned: Natural Airway and Nasal Cannula  Additional Equipment: None  Intra-op Plan:   Post-operative Plan:   Informed Consent: I have reviewed the patients History and Physical, chart, labs and discussed the procedure including the risks, benefits and alternatives for the proposed anesthesia with the patient or authorized representative who has indicated his/her understanding and acceptance.     Dental Advisory Given  Plan Discussed with: CRNA  Anesthesia Plan Comments:         Anesthesia Quick Evaluation

## 2024-03-27 NOTE — Op Note (Addendum)
 [facility] Patient Name: Diana Hall Procedure Date: 03/27/2024 10:50 AM MRN: [id number] Date of Birth: 1946/01/20 Attending MD: Deatrice Dine , MD, [id number] CSN: [id number] Age: 78 Admit Type: Outpatient Procedure:                Colonoscopy Indications:              Follow-up of diverticulitis Providers:                Deatrice Dine, MD, Devere Lodge, Bascom Blush Referring MD:              Medicines:                Monitored Anesthesia Care Complications:            No immediate complications. Estimated Blood Loss:     Estimated blood loss was minimal. Procedure:                Pre-Anesthesia Assessment:                           - Prior to the procedure, a History and Physical                            was performed, and patient medications and                            allergies were reviewed. The patient's tolerance of                            previous anesthesia was also reviewed. The risks                            and benefits of the procedure and the sedation                            options and risks were discussed with the patient.                            All questions were answered, and informed consent                            was obtained. Prior Anticoagulants: The patient has                            taken no anticoagulant or antiplatelet agents                            except for aspirin . ASA Grade Assessment: II - A                            patient with mild systemic disease. After reviewing                            the risks and benefits, the patient was deemed in  satisfactory condition to undergo the procedure.                           After obtaining informed consent, the colonoscope                            was passed under direct vision. Throughout the                            procedure, the patient's blood pressure, pulse, and                            oxygen saturations were monitored continuously.  The                            [id number]-HQ190L ([id number]) Peds Colon was introduced                            through the anus and advanced to the the cecum,                            identified by appendiceal orifice and ileocecal                            valve. The colonoscopy was performed without                            difficulty. The patient tolerated the procedure                            well. The quality of the bowel preparation was                            evaluated using the BBPS [facility] Bowel Preparation                            Scale) with scores of: Right Colon = 3, Transverse                            Colon = 3 and Left Colon = 3 (entire mucosa seen                            well with no residual staining, small fragments of                            stool or opaque liquid). The total BBPS score                            equals 9. The ileocecal valve, appendiceal orifice,                            and rectum were photographed. Scope In: 11:54:36 AM Scope Out: 12:25:16 PM Scope Withdrawal Time: 0 hours 23  minutes 28 seconds  Total Procedure Duration: 0 hours 30 minutes 40 seconds  Findings:      A 15 mm polyp was found in the cecum. The polyp was sessile.       Preparations were made for mucosal resection. Demarcation of the lesion       was performed with high-definition white light and narrow band imaging       to clearly identify the boundaries of the lesion. Eleview was injected       to raise the lesion. Snare mucosal resection was performed. Resection       and retrieval were complete. Resected tissue margins were examined and       clear of polyp tissue.      A 4 mm polyp was found in the cecum. The polyp was sessile. The polyp       was removed with a cold snare. Resection and retrieval were complete.      A 15 mm polyp was found in the ascending colon. The polyp was sessile.       Preparations were made for mucosal resection. Demarcation of the lesion        was performed with high-definition white light and narrow band imaging       to clearly identify the boundaries of the lesion. Eleview was injected       to raise the lesion. Snare mucosal resection was performed. Resection       and retrieval were complete. Resected tissue margins were examined and       clear of polyp tissue.      A 4 mm polyp was found in the ascending colon. The polyp was sessile.       The polyp was removed with a cold snare. Resection and retrieval were       complete.      Scattered medium-mouthed diverticula were found in the left colon.      Non-bleeding internal hemorrhoids were found during retroflexion. The       hemorrhoids were small.      Anal papilla(e) were hypertrophied. Impression:               - One 15 mm polyp in the cecum, removed with                            mucosal resection. Resected and retrieved.                           - One 4 mm polyp in the cecum, removed with a cold                            snare. Resected and retrieved.                           - One 15 mm polyp in the ascending colon, removed                            with mucosal resection. Resected and retrieved.                           - One 4 mm polyp in the ascending colon, removed  with a cold snare. Resected and retrieved.                           - Diverticulosis in the left colon.                           - Non-bleeding internal hemorrhoids.                           - Anal papilla(e) were hypertrophied.                           - Mucosal resection was performed. Resection and                            retrieval were complete.                           - Mucosal resection was performed. Resection and                            retrieval were complete. Moderate Sedation:      Per Anesthesia Care Recommendation:           - Patient has a contact number available for                            emergencies. The signs and symptoms of  potential                            delayed complications were discussed with the                            patient. Return to normal activities tomorrow.                            Written discharge instructions were provided to the                            patient.                           - Resume previous diet.                           - Continue present medications.                           - Await pathology results.                           - Repeat colonoscopy in 3 years for surveillance                            based on pathology results; if medically fit                           -  Return to GI office as previously scheduled. Procedure Code(s):        --- Professional ---                           [id number], Colonoscopy, flexible; with endoscopic                            mucosal resection                           [phone], 59, Colonoscopy, flexible; with removal of                            tumor(s), polyp(s), or other lesion(s) by snare                            technique Diagnosis Code(s):        --- Professional ---                           D12.0, Benign neoplasm of cecum                           D12.2, Benign neoplasm of ascending colon                           K64.8, Other hemorrhoids                           K62.89, Other specified diseases of anus and rectum                           K57.32, Diverticulitis of large intestine without                            perforation or abscess without bleeding                           K57.30, Diverticulosis of large intestine without                            perforation or abscess without bleeding CPT copyright 2022 American Medical Association. All rights reserved. The codes documented in this report are preliminary and upon coder review may  be revised to meet current compliance requirements. Deatrice Dine, MD Deatrice Dine, MD 03/27/2024 12:34:46 PM This report has been signed electronically. Number of Addenda:  0

## 2024-03-27 NOTE — Transfer of Care (Signed)
 Immediate Anesthesia Transfer of Care Note  Patient: Diana Hall  Procedure(s) Performed: COLONOSCOPY  Patient Location: Endoscopy Unit  Anesthesia Type:General  Level of Consciousness: awake, confused, and responds to stimulation  Airway & Oxygen Therapy: Patient Spontanous Breathing  Post-op Assessment: Report given to RN and Post -op Vital signs reviewed and stable  Post vital signs: Reviewed and stable  Last Vitals:  Vitals Value Taken Time  BP 132/54 03/27/24 12:30  Temp    Pulse 76 03/27/24 12:30  Resp 20 03/27/24 12:30  SpO2 98 % 03/27/24 12:30    Last Pain:  Vitals:   03/27/24 1230  TempSrc:   PainSc: 0-No pain         Complications: No notable events documented.

## 2024-03-27 NOTE — Anesthesia Postprocedure Evaluation (Signed)
 Anesthesia Post Note  Patient: Diana Hall  Procedure(s) Performed: COLONOSCOPY  Patient location during evaluation: Endoscopy Anesthesia Type: General Level of consciousness: awake and alert Pain management: pain level controlled Vital Signs Assessment: post-procedure vital signs reviewed and stable Respiratory status: spontaneous breathing, nonlabored ventilation and respiratory function stable Cardiovascular status: stable Anesthetic complications: no   There were no known notable events for this encounter.   Last Vitals:  Vitals:   03/27/24 1121 03/27/24 1230  BP: (!) 125/98 (!) 132/54  Pulse: 91 76  Resp: (!) 22 20  Temp: 36.6 C   SpO2: 97% 98%    Last Pain:  Vitals:   03/27/24 1230  TempSrc:   PainSc: 0-No pain                 Jahid Weida L Kierria Feigenbaum

## 2024-03-28 ENCOUNTER — Encounter (INDEPENDENT_AMBULATORY_CARE_PROVIDER_SITE_OTHER): Payer: Self-pay | Admitting: *Deleted

## 2024-03-28 LAB — SURGICAL PATHOLOGY

## 2024-03-29 ENCOUNTER — Ambulatory Visit (INDEPENDENT_AMBULATORY_CARE_PROVIDER_SITE_OTHER): Payer: Self-pay | Admitting: Gastroenterology

## 2024-03-29 ENCOUNTER — Encounter (HOSPITAL_COMMUNITY): Payer: Self-pay | Admitting: Gastroenterology

## 2024-03-29 NOTE — Progress Notes (Signed)
 3 yr TCS noted in recall Patient result letter mailed procedure note and pathology result faxed to PCP

## 2024-03-30 DIAGNOSIS — E118 Type 2 diabetes mellitus with unspecified complications: Secondary | ICD-10-CM | POA: Diagnosis not present

## 2024-03-30 DIAGNOSIS — F329 Major depressive disorder, single episode, unspecified: Secondary | ICD-10-CM | POA: Diagnosis not present

## 2024-03-30 DIAGNOSIS — E11649 Type 2 diabetes mellitus with hypoglycemia without coma: Secondary | ICD-10-CM | POA: Diagnosis not present

## 2024-03-30 DIAGNOSIS — R4189 Other symptoms and signs involving cognitive functions and awareness: Secondary | ICD-10-CM | POA: Diagnosis not present

## 2024-03-30 DIAGNOSIS — Z79899 Other long term (current) drug therapy: Secondary | ICD-10-CM | POA: Diagnosis not present

## 2024-03-30 DIAGNOSIS — Z713 Dietary counseling and surveillance: Secondary | ICD-10-CM | POA: Diagnosis not present

## 2024-03-30 DIAGNOSIS — Z6823 Body mass index (BMI) 23.0-23.9, adult: Secondary | ICD-10-CM | POA: Diagnosis not present

## 2024-03-30 DIAGNOSIS — I10 Essential (primary) hypertension: Secondary | ICD-10-CM | POA: Diagnosis not present

## 2024-04-12 DIAGNOSIS — D122 Benign neoplasm of ascending colon: Secondary | ICD-10-CM

## 2024-04-24 DIAGNOSIS — E118 Type 2 diabetes mellitus with unspecified complications: Secondary | ICD-10-CM | POA: Diagnosis not present

## 2024-05-02 DIAGNOSIS — Z0000 Encounter for general adult medical examination without abnormal findings: Secondary | ICD-10-CM | POA: Diagnosis not present

## 2024-05-02 DIAGNOSIS — E1122 Type 2 diabetes mellitus with diabetic chronic kidney disease: Secondary | ICD-10-CM | POA: Diagnosis not present

## 2024-05-02 DIAGNOSIS — R809 Proteinuria, unspecified: Secondary | ICD-10-CM | POA: Diagnosis not present

## 2024-05-02 DIAGNOSIS — R945 Abnormal results of liver function studies: Secondary | ICD-10-CM | POA: Diagnosis not present

## 2024-05-02 DIAGNOSIS — F329 Major depressive disorder, single episode, unspecified: Secondary | ICD-10-CM | POA: Diagnosis not present

## 2024-05-02 DIAGNOSIS — I10 Essential (primary) hypertension: Secondary | ICD-10-CM | POA: Diagnosis not present

## 2024-05-02 DIAGNOSIS — I129 Hypertensive chronic kidney disease with stage 1 through stage 4 chronic kidney disease, or unspecified chronic kidney disease: Secondary | ICD-10-CM | POA: Diagnosis not present

## 2024-05-02 DIAGNOSIS — E782 Mixed hyperlipidemia: Secondary | ICD-10-CM | POA: Diagnosis not present

## 2024-05-02 DIAGNOSIS — N182 Chronic kidney disease, stage 2 (mild): Secondary | ICD-10-CM | POA: Diagnosis not present

## 2024-05-08 ENCOUNTER — Other Ambulatory Visit: Payer: Self-pay | Admitting: Internal Medicine

## 2024-05-08 DIAGNOSIS — Z1231 Encounter for screening mammogram for malignant neoplasm of breast: Secondary | ICD-10-CM

## 2024-05-24 ENCOUNTER — Emergency Department (HOSPITAL_COMMUNITY)

## 2024-05-24 ENCOUNTER — Emergency Department (HOSPITAL_COMMUNITY)
Admission: EM | Admit: 2024-05-24 | Discharge: 2024-05-24 | Discharge status: Against Medical Advice | Attending: Emergency Medicine | Admitting: Emergency Medicine

## 2024-05-24 ENCOUNTER — Other Ambulatory Visit: Payer: Self-pay

## 2024-05-24 DIAGNOSIS — Z7982 Long term (current) use of aspirin: Secondary | ICD-10-CM | POA: Insufficient documentation

## 2024-05-24 DIAGNOSIS — R410 Disorientation, unspecified: Secondary | ICD-10-CM | POA: Insufficient documentation

## 2024-05-24 DIAGNOSIS — R42 Dizziness and giddiness: Secondary | ICD-10-CM | POA: Insufficient documentation

## 2024-05-24 DIAGNOSIS — W19XXXA Unspecified fall, initial encounter: Secondary | ICD-10-CM | POA: Insufficient documentation

## 2024-05-24 DIAGNOSIS — Z5329 Procedure and treatment not carried out because of patient's decision for other reasons: Secondary | ICD-10-CM | POA: Insufficient documentation

## 2024-05-24 DIAGNOSIS — Z794 Long term (current) use of insulin: Secondary | ICD-10-CM | POA: Diagnosis not present

## 2024-05-24 DIAGNOSIS — Y93K1 Activity, walking an animal: Secondary | ICD-10-CM | POA: Diagnosis not present

## 2024-05-24 DIAGNOSIS — D696 Thrombocytopenia, unspecified: Secondary | ICD-10-CM | POA: Diagnosis not present

## 2024-05-24 DIAGNOSIS — Y92410 Unspecified street and highway as the place of occurrence of the external cause: Secondary | ICD-10-CM | POA: Insufficient documentation

## 2024-05-24 LAB — COMPREHENSIVE METABOLIC PANEL WITH GFR
ALT: 18 U/L (ref 0–44)
AST: 22 U/L (ref 15–41)
Albumin: 4.1 g/dL (ref 3.5–5.0)
Alkaline Phosphatase: 76 U/L (ref 38–126)
Anion gap: 8 (ref 5–15)
BUN: 13 mg/dL (ref 8–23)
CO2: 30 mmol/L (ref 22–32)
Calcium: 9.3 mg/dL (ref 8.9–10.3)
Chloride: 105 mmol/L (ref 98–111)
Creatinine, Ser: 0.7 mg/dL (ref 0.44–1.00)
GFR, Estimated: >60 mL/min
Glucose, Bld: 77 mg/dL (ref 70–99)
Potassium: 2.7 mmol/L — CL (ref 3.5–5.1)
Sodium: 142 mmol/L (ref 135–145)
Total Bilirubin: 0.5 mg/dL (ref 0.0–1.2)
Total Protein: 6.6 g/dL (ref 6.5–8.1)

## 2024-05-24 LAB — CBC WITH DIFFERENTIAL/PLATELET
Abs Immature Granulocytes: 0.01 K/uL (ref 0.00–0.07)
Basophils Absolute: 0.1 K/uL (ref 0.0–0.1)
Basophils Relative: 1 %
Eosinophils Absolute: 0.1 K/uL (ref 0.0–0.5)
Eosinophils Relative: 2 %
HCT: 41.7 % (ref 36.0–46.0)
Hemoglobin: 13.9 g/dL (ref 12.0–15.0)
Immature Granulocytes: 0 %
Lymphocytes Relative: 23 %
Lymphs Abs: 1.4 K/uL (ref 0.7–4.0)
MCH: 27.4 pg (ref 26.0–34.0)
MCHC: 33.3 g/dL (ref 30.0–36.0)
MCV: 82.1 fL (ref 80.0–100.0)
Monocytes Absolute: 0.5 K/uL (ref 0.1–1.0)
Monocytes Relative: 8 %
Neutro Abs: 4.1 K/uL (ref 1.7–7.7)
Neutrophils Relative %: 66 %
Platelets: 147 K/uL — ABNORMAL LOW (ref 150–400)
RBC: 5.08 MIL/uL (ref 3.87–5.11)
RDW: 14.3 % (ref 11.5–15.5)
WBC: 6.2 K/uL (ref 4.0–10.5)
nRBC: 0 % (ref 0.0–0.2)

## 2024-05-24 LAB — CBG MONITORING, ED: Glucose-Capillary: 87 mg/dL (ref 70–99)

## 2024-05-24 MED ORDER — POTASSIUM CHLORIDE CRYS ER 20 MEQ PO TBCR: 20.0000 meq | EXTENDED_RELEASE_TABLET | Freq: Two times a day (BID) | ORAL | 0 refills | Status: AC

## 2024-05-24 NOTE — ED Provider Notes (Signed)
 " [place] EM[facility] AT [facility] Provider Note   CSN: [id number] Arrival date & time: 05/24/24  1002     Patient presents with: Fall   Diana Hall is a 78 y.o. female.  Is ER today for further evaluation after a fall.  This occurred on 12/20.  She went to urgent care was found to have a metacarpal fracture and is followed up with orthopedics.  Since then, her husband states that she has had increasing confusion, she is she has some mild dizziness.  They called her PCP who wanted her to have evaluation in the ED for worsening symptoms to rule out possible head bleed specifically.  She denies headache, denies vision changes, no nausea or vomiting.  Dates that fall occurred outside, she was walking her dog and does not know what happened but was laying on the road, her neighbor was flagged down and called her husband to get her up.  She is not sure if she got dizzy or if she passed out, or if she simply tripped.  She does think she hit her head.  She is not on blood thinners.    Fall       Prior to Admission medications  Medication Sig Start Date End Date Taking? Authorizing Provider  aspirin  EC 81 MG tablet Take 1 tablet (81 mg total) by mouth daily. Swallow whole. Patient not taking: Reported on 02/22/2024 09/11/20   Ines Onetha NOVAK, MD  atorvastatin (LIPITOR) 40 MG tablet Take 40 mg by mouth at bedtime.    [provider]  FARXIGA 10 MG TABS tablet Take 10 mg by mouth daily. 11/27/23   [provider]  Insulin  Degludec (TRESIBA Branch) Inject into the skin. 30 units    [provider]  losartan (COZAAR) 100 MG tablet Take 100 mg by mouth daily.    [provider]  Probiotic Product (PROBIOTIC DAILY PO) Take by mouth.    [provider]  psyllium (METAMUCIL) 58.6 % powder Take 1 packet by mouth 3 (three) times daily.    [provider]  sertraline (ZOLOFT) 25 MG tablet Take 25 mg by mouth daily.    [provider]  sitaGLIPtin (JANUVIA) 100 MG tablet Take 100 mg by mouth daily.    [provider]  Sodium Sulfate-Mag Sulfate-KCl (SUTAB ) [id number] MG TABS As directed 10/22/23   Ahmed, Muhammad F, MD    Allergies: Iodinated contrast media, Penicillins, Calcium citrate, Hydrochlorothiazide, and Hydrocodone-acetaminophen     Review of Systems  Updated Vital Signs BP (!) 151/96   Pulse 67   Temp 97.7 F (36.5 C) (Oral)   Resp 17   Ht 5' 4 (1.626 m)   Wt 59 kg   SpO2 95%   BMI 22.31 kg/m   Physical Exam Vitals and nursing note reviewed.  Constitutional:      General: She is not in acute distress.    Appearance: She is well-developed.  HENT:     Head: Normocephalic and atraumatic.     Nose: Nose normal.     Mouth/Throat:     Mouth: Mucous membranes are moist.  Eyes:     Extraocular Movements: Extraocular movements intact.     Conjunctiva/sclera: Conjunctivae normal.     Pupils: Pupils are equal, round, and reactive to light.  Cardiovascular:     Rate and Rhythm: Normal rate and regular rhythm.     Heart sounds: No murmur heard. Pulmonary:     Effort: Pulmonary effort  is normal. No respiratory distress.     Breath sounds: Normal breath sounds.  Abdominal:     Palpations: Abdomen is soft.     Tenderness: There is no abdominal tenderness.  Musculoskeletal:        General: No swelling.     Cervical back: Normal range of motion and neck supple.  Skin:    General: Skin is warm and dry.     Capillary Refill: Capillary refill takes less than 2 seconds.  Neurological:     General: No focal deficit present.     Mental Status: She is alert and oriented to person, place, and time.     Sensory: No sensory deficit.     Motor: No weakness.     Coordination: Coordination normal.  Psychiatric:        Mood and Affect: Mood normal.     (all labs ordered are listed, but only abnormal results are displayed) Labs Reviewed - No data to  display  EKG: None  Radiology: No results found.   Procedures   Medications Ordered in the ED - No data to display                                  Medical Decision Making Ddx: concussion, ICH, contusion, fracture, metabolic encephalopathy, CVA, other ED course: Patient presents the ER today complaining of increasing confusion since a fall about a week and a half ago.  She states her PCP suggested she come have a CT of her head and alcohol brain bleed and had further workup.  She is alert and oriented, does have confusion about when she fell, does not remember how she fell, is unsure if she had a loss of consciousness.  He is not having any current chest pain, shortness of breath, dizziness or weakness.  She has no focal symptoms on exam.  Head CT and CTA and CT were ordered and both showed no acute findings.  I had ordered some labs as well as urinalysis to rule out other causes of possible encephalopathy and to reassess and discuss admission versus discharge.  EKG showed a regular rhythm, CBC resulted and did not show any leukocytosis or anemia.  Urinalysis and CMP were pending and patient and her husband were very adamant that they wanted to leave, they stated they did not want any further testing, they were just wanting to have a brain bleed ruled out and wanted to leave.  I explained to them that they are expressing that she is having gradually worsening confusion which is concerning for other etiologies such as infection or neurological problems.  Discussed that we need to get further tests and they stated that their primary doctor could do this.  I expressed my concern that it is a holiday and they may not be able to get into the doctor in a timely fashion and that she could have continued deterioration of this could lead to significant morbidity or mentality.  They verbalized understanding and still are adamant they want to leave, they were also advised by Dr. Jakie and still did not  want to stay for further evaluation.  Patient did not provide a urine sample but CMP was pending at the time that they left.  I did get a critical finding that calcium was 2.7, I attempted to call patient's phone number listed on the chart x 2 with no answer.  There was no message for  voicemail box mother was a town so I attempted to leave a engineer, technical sales.  I sent a prescription for potassium.  Patient's husband is her emergency contact with the same phone number as her so was not able to try any other phone numbers.  Amount and/or Complexity of Data Reviewed Labs: ordered.    Details: CBC shows thrombocytopenia with platelets 147 and CMP with potassium 2.7 Radiology: ordered and independent interpretation performed.    Details: Head shows no acute intracranial hemorrhage, no skull fracture CT C-spine shows degenerative changes no acute fracture or traumatic malalignment I agree with radiology reading. ECG/medicine tests: ordered and independent interpretation performed. Decision-making details documented in ED Course.  Risk Prescription drug management.        Final diagnoses:  None    ED Discharge Orders     None          Suellen Sherran DELENA DEVONNA 05/24/24 1606    Yolande Lamar BROCKS, MD 05/31/24 0710  "

## 2024-05-24 NOTE — ED Notes (Signed)
 Date and time results received: 05/24/2024 1532  Test: K+ Critical Value: 2.7  Name of Provider Notified: Yolande SAUNDERS MD

## 2024-05-24 NOTE — ED Notes (Signed)
 Pt stated that she just came to ED for a CT scan per her PCP. Pt stated she did not want to wait any longer as her scan had been completed over an hour ago; total time in ED 5:02. Unable to obtain discharge vitals. Pt appeared stable and well and has ambulated on her own exiting the floor.

## 2024-05-24 NOTE — Discharge Instructions (Addendum)
 You were evaluated in the emergency department for worsening confusion after a fall.  We do not have all of your results occluding your urinalysis and your CMP (kidney function, liver function and electrolytes).  Electing to leave AGAINST MEDICAL ADVICE, understanding that we do not know it is causing your confusion.  Fortunately we did find that your CT scan of your brain and neck did not show any emergent findings including a brain bleed or broken bone.  Support follow-up with your primary care doctor, if you change your mind, or have worsening symptoms please come back to the ER right away.  As discussed we cannot rule out any severe life-threatening cause of your symptoms without further testing, you have verbalized understand this and without any further testing at this time.

## 2024-05-24 NOTE — ED Triage Notes (Signed)
 Pt was walking her dog on Sat and fell onto the road. Pt had lacerations to her face . Pt went to UC and they xray her right wrist and treated it. Pt is c/o right rib pain , confusion noted by husband, and PCP wants more testing done.

## 2024-06-13 ENCOUNTER — Ambulatory Visit
Admission: RE | Admit: 2024-06-13 | Discharge: 2024-06-13 | Disposition: A | Source: Ambulatory Visit | Attending: Internal Medicine | Admitting: Internal Medicine

## 2024-06-13 DIAGNOSIS — Z1231 Encounter for screening mammogram for malignant neoplasm of breast: Secondary | ICD-10-CM

## 2024-06-29 ENCOUNTER — Encounter: Payer: Self-pay | Admitting: *Deleted

## 2024-06-29 NOTE — Progress Notes (Signed)
 [facility]SE[facility]TT                                          MRN: [id number]   06/29/2024   The [facility] Quality Team Specialist reviewed this patient medical record for the purposes of chart review for care gap closure. The following were reviewed: chart review for care gap closure-controlling blood pressure.    [facility] Quality Team
